# Patient Record
Sex: Male | Born: 1960 | Race: Black or African American | Hispanic: No | Marital: Single | Smoking: Current every day smoker
Health system: Southern US, Community
[De-identification: ages and names within clinical notes are randomized; demographics above are authoritative.]

## PROBLEM LIST (undated history)

## (undated) DIAGNOSIS — E785 Hyperlipidemia, unspecified: Secondary | ICD-10-CM

## (undated) DIAGNOSIS — K4090 Unilateral inguinal hernia, without obstruction or gangrene, not specified as recurrent: Secondary | ICD-10-CM

## (undated) DIAGNOSIS — I10 Essential (primary) hypertension: Secondary | ICD-10-CM

## (undated) DIAGNOSIS — M109 Gout, unspecified: Secondary | ICD-10-CM

---

## 2017-07-09 ENCOUNTER — Encounter: Payer: Self-pay | Admitting: Emergency Medicine

## 2017-07-09 ENCOUNTER — Emergency Department
Admission: EM | Admit: 2017-07-09 | Discharge: 2017-07-09 | Disposition: A | Discharge status: Home / Self Care | Payer: Self-pay | Attending: Emergency Medicine | Admitting: Emergency Medicine

## 2017-07-09 ENCOUNTER — Emergency Department: Payer: Self-pay

## 2017-07-09 DIAGNOSIS — R42 Dizziness and giddiness: Secondary | ICD-10-CM | POA: Insufficient documentation

## 2017-07-09 DIAGNOSIS — I10 Essential (primary) hypertension: Secondary | ICD-10-CM | POA: Insufficient documentation

## 2017-07-09 DIAGNOSIS — F17200 Nicotine dependence, unspecified, uncomplicated: Secondary | ICD-10-CM | POA: Insufficient documentation

## 2017-07-09 LAB — BASIC METABOLIC PANEL
ANION GAP: 12 (ref 5–15)
BUN: 14 mg/dL (ref 6–20)
CO2: 23 mmol/L (ref 22–32)
Calcium: 9.1 mg/dL (ref 8.9–10.3)
Chloride: 103 mmol/L (ref 101–111)
Creatinine, Ser: 1.03 mg/dL (ref 0.61–1.24)
Glucose, Bld: 139 mg/dL — ABNORMAL HIGH (ref 65–99)
Potassium: 4 mmol/L (ref 3.5–5.1)
SODIUM: 138 mmol/L (ref 135–145)

## 2017-07-09 LAB — URINALYSIS, COMPLETE (UACMP) WITH MICROSCOPIC
BILIRUBIN URINE: NEGATIVE
Bacteria, UA: NONE SEEN
Glucose, UA: 50 mg/dL — AB
HGB URINE DIPSTICK: NEGATIVE
Ketones, ur: NEGATIVE mg/dL
LEUKOCYTES UA: NEGATIVE
NITRITE: NEGATIVE
PH: 5 (ref 5.0–8.0)
Protein, ur: 100 mg/dL — AB
SPECIFIC GRAVITY, URINE: 1.02 (ref 1.005–1.030)

## 2017-07-09 LAB — CBC
HCT: 46.5 % (ref 40.0–52.0)
Hemoglobin: 15.5 g/dL (ref 13.0–18.0)
MCH: 30.4 pg (ref 26.0–34.0)
MCHC: 33.3 g/dL (ref 32.0–36.0)
MCV: 91.2 fL (ref 80.0–100.0)
Platelets: 334 10*3/uL (ref 150–440)
RBC: 5.1 MIL/uL (ref 4.40–5.90)
RDW: 12.8 % (ref 11.5–14.5)
WBC: 6.7 10*3/uL (ref 3.8–10.6)

## 2017-07-09 LAB — GLUCOSE, CAPILLARY: Glucose-Capillary: 100 mg/dL — ABNORMAL HIGH (ref 65–99)

## 2017-07-09 LAB — TROPONIN I

## 2017-07-09 MED ORDER — AMLODIPINE BESYLATE 10 MG PO TABS: 10.0000 mg | ORAL_TABLET | Freq: Every day | ORAL | 1 refills | Status: DC

## 2017-07-09 MED ORDER — MECLIZINE HCL 25 MG PO TABS
25.0000 mg | ORAL_TABLET | Freq: Once | ORAL | Status: AC
  Administered 2017-07-09: 25 mg via ORAL
  Filled 2017-07-09: qty 1

## 2017-07-09 MED ORDER — MECLIZINE HCL 25 MG PO TABS: 25.0000 mg | ORAL_TABLET | Freq: Three times a day (TID) | ORAL | 0 refills | Status: DC | PRN

## 2017-07-09 MED ORDER — SODIUM CHLORIDE 0.9 % IV SOLN
1000.0000 mL | Freq: Once | INTRAVENOUS | Status: AC
  Administered 2017-07-09: 1000 mL via INTRAVENOUS

## 2017-07-09 NOTE — ED Notes (Signed)
Pt refused wheelchair to the lobby at this time.  

## 2017-07-09 NOTE — ED Triage Notes (Signed)
Pt c/o lightheadedness for 2 days.  Worse with change position but describes as in head not room spinning when asked.  Has been "spitting up a whole lot". Denies hx vertigo. No pain. Alert and oriented. VSS. No distress noted.

## 2017-07-09 NOTE — ED Notes (Signed)
NAD noted at time of D/C. Pt denies questions or concerns. Pt ambulatory to the lobby at this time.  

## 2017-07-09 NOTE — ED Provider Notes (Addendum)
[facility] Emergency Department Provider Note   ____________________________________________    I have reviewed the triage vital signs and the nursing notes.   HISTORY  Chief Complaint Dizziness     HPI Gary Webb is a 56 y.o. male Who presents with complaints of dizziness. He describes it as a sensation of the room spinning. This started this morning. He has never had this before. He denies neuro deficits or headache. Does feel nauseated when the room is spinning. One episode of vomiting. No fevers or chills. No neck pain. No abdominal pain. No new medications. Has not taken anything for this.no chest pain   History reviewed. No pertinent past medical history.  There are no active problems to display for this patient.   History reviewed. No pertinent surgical history.  Prior to Admission medications   Medication Sig Start Date End Date Taking? Authorizing Provider  amLODipine (NORVASC) 10 MG tablet Take 1 tablet (10 mg total) by mouth daily. 07/09/17 07/09/18  Jene Every, MD  meclizine (ANTIVERT) 25 MG tablet Take 1 tablet (25 mg total) by mouth 3 (three) times daily as needed for dizziness. 07/09/17   Jene Every, MD     Allergies Patient has no known allergies.  History reviewed. No pertinent family history.  Social History Social History  Substance Use Topics  . Smoking status: Current Every Day Smoker  . Smokeless tobacco: Never Used  . Alcohol use No    Review of Systems  Constitutional: No fever/chills Eyes: No visual changes.  ENT: No sore throat. Cardiovascular: Denies chest pain. Respiratory: Denies shortness of breath. Gastrointestinal: No abdominal pain.  Genitourinary: Negative for dysuria. Musculoskeletal: Negative for back pain. Skin: Negative for rash. Neurological: as above   ____________________________________________   PHYSICAL EXAM:  VITAL SIGNS: ED Triage Vitals  Enc Vitals Group     BP  07/09/17 0908 (!) 184/92     Pulse Rate 07/09/17 0907 75     Resp 07/09/17 0907 (!) 22     Temp 07/09/17 0907 (!) 97.5 F (36.4 C)     Temp Source 07/09/17 0907 Oral     SpO2 07/09/17 0907 98 %     Weight 07/09/17 0907 81.6 kg (180 lb)     Height 07/09/17 0907 1.676 m (5\' 6" )     Head Circumference --      Peak Flow --      Pain Score --      Pain Loc --      Pain Edu? --      Excl. in GC? --     Constitutional: Alert and oriented. No acute distress. Pleasant and interactive Eyes: Conjunctivae are normal. PERRLA, EOMI Head: Atraumatic.  Mouth/Throat: Mucous membranes are moist.   Neck:  Painless ROM Cardiovascular: Normal rate, regular rhythm. Grossly normal heart sounds.  Good peripheral circulation. Respiratory: Normal respiratory effort.  No retractions. Lungs CTAB. Gastrointestinal: Soft and nontender. No distention.  No CVA tenderness.  Musculoskeletal: No lower extremity tenderness nor edema.  Warm and well perfused Neurologic:  Normal speech and language. No gross focal neurologic deficits are appreciated. cranial nerves II through XII normal Skin:  Skin is warm, dry and intact. No rash noted. Psychiatric: Mood and affect are normal. Speech and behavior are normal.  ____________________________________________   LABS (all labs ordered are listed, but only abnormal results are displayed)  Labs Reviewed  BASIC METABOLIC PANEL - Abnormal; Notable for the following:       Result Value  Glucose, Bld 139 (*)    All other components within normal limits  URINALYSIS, COMPLETE (UACMP) WITH MICROSCOPIC - Abnormal; Notable for the following:    Color, Urine YELLOW (*)    APPearance CLEAR (*)    Glucose, UA 50 (*)    Protein, ur 100 (*)    Squamous Epithelial / LPF 0-5 (*)    All other components within normal limits  GLUCOSE, CAPILLARY - Abnormal; Notable for the following:    Glucose-Capillary 100 (*)    All other components within normal limits  CBC  TROPONIN I    CBG MONITORING, ED   ____________________________________________  EKG  ED ECG REPORT I, Jene EveryKINNER, Johneisha Broaden, the attending physician, personally viewed and interpreted this ECG.  Date: 07/09/2017  Rhythm: normal sinus rhythm QRS Axis: normal Intervals: normal ST/T Wave abnormalities: nonspecific changes Narrative Interpretation: no evidence of acute ischemia  ____________________________________________  RADIOLOGY  CT head no acute distress ____________________________________________   PROCEDURES  Procedure(s) performed: No    Critical Care performed: No ____________________________________________   INITIAL IMPRESSION / ASSESSMENT AND PLAN / ED COURSE  Pertinent labs & imaging results that were available during my care of the patient were reviewed by me and considered in my medical decision making (see chart for details).  patient presents with symptoms of vertigo. No neuro deficits or concerning findings. We will treat with IV fluids, meclizine and nausea medications and reevaluate.lab work is reassuring.  After treatment patient reports feeling significant better. He is steady on his feet. CT scan unremarkable. We'll discharge with meclizine and ENT follow-up. Return precautions discussed.  Patient with a history of high blood pressure, not taking any medications. I will prescribe him amlodipine for his chronic hypertension, recommended follow-up to recheck his blood pressure in one week    ____________________________________________   FINAL CLINICAL IMPRESSION(S) / ED DIAGNOSES  Final diagnoses:  Vertigo  Essential hypertension      NEW MEDICATIONS STARTED DURING THIS VISIT:  Discharge Medication List as of 07/09/2017  1:17 PM    START taking these medications   Details  amLODipine (NORVASC) 10 MG tablet Take 1 tablet (10 mg total) by mouth daily., Starting Thu 07/09/2017, Until Fri 07/09/2018, Print    meclizine (ANTIVERT) 25 MG tablet Take 1  tablet (25 mg total) by mouth 3 (three) times daily as needed for dizziness., Starting Thu 07/09/2017, Print         Note:  This document was prepared using Dragon voice recognition software and may include unintentional dictation errors.    Jene EveryKinner, Ardelia Wrede, MD 07/09/17 1507    Jene EveryKinner, Charlean Carneal, MD 07/09/17 [phone]

## 2018-05-20 ENCOUNTER — Other Ambulatory Visit: Payer: Self-pay

## 2018-05-20 ENCOUNTER — Emergency Department: Payer: Self-pay

## 2018-05-20 ENCOUNTER — Encounter: Payer: Self-pay | Admitting: *Deleted

## 2018-05-20 ENCOUNTER — Emergency Department
Admission: EM | Admit: 2018-05-20 | Discharge: 2018-05-20 | Disposition: A | Discharge status: Home / Self Care | Payer: Self-pay | Attending: Emergency Medicine | Admitting: Emergency Medicine

## 2018-05-20 DIAGNOSIS — S50811A Abrasion of right forearm, initial encounter: Secondary | ICD-10-CM | POA: Insufficient documentation

## 2018-05-20 DIAGNOSIS — Y9389 Activity, other specified: Secondary | ICD-10-CM | POA: Insufficient documentation

## 2018-05-20 DIAGNOSIS — F17200 Nicotine dependence, unspecified, uncomplicated: Secondary | ICD-10-CM | POA: Insufficient documentation

## 2018-05-20 DIAGNOSIS — Z760 Encounter for issue of repeat prescription: Secondary | ICD-10-CM | POA: Insufficient documentation

## 2018-05-20 DIAGNOSIS — Y999 Unspecified external cause status: Secondary | ICD-10-CM | POA: Insufficient documentation

## 2018-05-20 DIAGNOSIS — I10 Essential (primary) hypertension: Secondary | ICD-10-CM | POA: Insufficient documentation

## 2018-05-20 DIAGNOSIS — S50819A Abrasion of unspecified forearm, initial encounter: Secondary | ICD-10-CM

## 2018-05-20 DIAGNOSIS — Y929 Unspecified place or not applicable: Secondary | ICD-10-CM | POA: Insufficient documentation

## 2018-05-20 LAB — CBC WITH DIFFERENTIAL/PLATELET
Basophils Absolute: 0 10*3/uL (ref 0–0.1)
Basophils Relative: 1 %
Eosinophils Absolute: 0.1 10*3/uL (ref 0–0.7)
Eosinophils Relative: 1 %
HEMATOCRIT: 44.4 % (ref 40.0–52.0)
Hemoglobin: 15.5 g/dL (ref 13.0–18.0)
LYMPHS ABS: 1.9 10*3/uL (ref 1.0–3.6)
LYMPHS PCT: 35 %
MCH: 31.7 pg (ref 26.0–34.0)
MCHC: 34.9 g/dL (ref 32.0–36.0)
MCV: 90.8 fL (ref 80.0–100.0)
MONO ABS: 0.4 10*3/uL (ref 0.2–1.0)
MONOS PCT: 8 %
Neutro Abs: 3 10*3/uL (ref 1.4–6.5)
Neutrophils Relative %: 55 %
Platelets: 384 10*3/uL (ref 150–440)
RBC: 4.88 MIL/uL (ref 4.40–5.90)
RDW: 13 % (ref 11.5–14.5)
WBC: 5.4 10*3/uL (ref 3.8–10.6)

## 2018-05-20 LAB — COMPREHENSIVE METABOLIC PANEL
ALBUMIN: 4.4 g/dL (ref 3.5–5.0)
ALT: 32 U/L (ref 0–44)
ANION GAP: 8 (ref 5–15)
AST: 28 U/L (ref 15–41)
Alkaline Phosphatase: 88 U/L (ref 38–126)
BUN: 22 mg/dL — ABNORMAL HIGH (ref 6–20)
CO2: 23 mmol/L (ref 22–32)
Calcium: 9.5 mg/dL (ref 8.9–10.3)
Chloride: 109 mmol/L (ref 98–111)
Creatinine, Ser: 1.03 mg/dL (ref 0.61–1.24)
GFR calc Af Amer: >60 mL/min (ref >60)
GFR calc non Af Amer: >60 mL/min (ref >60)
GLUCOSE: 108 mg/dL — AB (ref 70–99)
POTASSIUM: 3.9 mmol/L (ref 3.5–5.1)
SODIUM: 140 mmol/L (ref 135–145)
Total Bilirubin: 0.7 mg/dL (ref 0.3–1.2)
Total Protein: 8.4 g/dL — ABNORMAL HIGH (ref 6.5–8.1)

## 2018-05-20 MED ORDER — AMLODIPINE BESYLATE 10 MG PO TABS: 10.0000 mg | ORAL_TABLET | Freq: Every day | ORAL | 1 refills | Status: DC

## 2018-05-20 NOTE — ED Notes (Signed)
McShane MD at bedside. 
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
## 2018-05-20 NOTE — Discharge Instructions (Signed)
Keep the area clean and dry, change the dressings on there twice a day as we have described, follow-up with the wound care, if you get redness around the area fever, streaks from the area increased pain or other concerns return to the emergency department.
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
## 2018-07-20 ENCOUNTER — Emergency Department
Admission: EM | Admit: 2018-07-20 | Discharge: 2018-07-20 | Disposition: A | Discharge status: Home / Self Care | Payer: Self-pay | Attending: Emergency Medicine | Admitting: Emergency Medicine

## 2018-07-20 ENCOUNTER — Encounter: Payer: Self-pay | Admitting: Emergency Medicine

## 2018-07-20 ENCOUNTER — Other Ambulatory Visit: Payer: Self-pay

## 2018-07-20 DIAGNOSIS — F17200 Nicotine dependence, unspecified, uncomplicated: Secondary | ICD-10-CM | POA: Insufficient documentation

## 2018-07-20 DIAGNOSIS — I10 Essential (primary) hypertension: Secondary | ICD-10-CM | POA: Insufficient documentation

## 2018-07-20 DIAGNOSIS — Z5189 Encounter for other specified aftercare: Secondary | ICD-10-CM | POA: Insufficient documentation

## 2018-07-20 DIAGNOSIS — Z760 Encounter for issue of repeat prescription: Secondary | ICD-10-CM | POA: Insufficient documentation

## 2018-07-20 HISTORY — DX: Essential (primary) hypertension: I10

## 2018-07-20 MED ORDER — AMLODIPINE BESYLATE 10 MG PO TABS: 10.0000 mg | ORAL_TABLET | Freq: Every day | ORAL | 1 refills | Status: DC

## 2018-07-20 NOTE — Discharge Instructions (Signed)
Please establish care with primary care as soon as possible for further work-up of your high blood pressure and continue management of your medication.

## 2018-07-20 NOTE — ED Notes (Signed)
See triage note. Pt presents for refill of amlodipine. Pt states he went to [facility] today to get refill and was told he needs a new Rx. Last prescribed by Alphonzo Lemmings, MD. Pt also states he was told to have R elbow wound checked. Wound appears to have healed completely. No s/sx infection noted at this time.

## 2018-07-20 NOTE — ED Provider Notes (Signed)
[facility] Emergency Department Provider Note  ____________________________________________  Time seen: Approximately 2:23 PM  I have reviewed the triage vital signs and the nursing notes.   HISTORY  Chief Complaint Wound Check    HPI Gary Webb is a 57 y.o. male presents emergency department for medication refill and wound recheck.  Patient states that he had a wound to his right elbow 2 months ago.  He was told to follow-up and have it looked out again.  He states that wound has completely healed.  It does not hurt to touch and he has no difficulty moving his elbow.  He was given a prescription for amlodipine 2 months ago from the emergency department but does not have a primary care provider so has not been able to get a refill.  He has [facility] this morning to refill prescription and was told that there are no more refills.  He has been taking his blood pressure daily.  No headache, chest pain, shortness of breath.   Past Medical History:  Diagnosis Date  . Hypertension     There are no active problems to display for this patient.   History reviewed. No pertinent surgical history.  Prior to Admission medications   Medication Sig Start Date End Date Taking? Authorizing Provider  amLODipine (NORVASC) 10 MG tablet Take 1 tablet (10 mg total) by mouth daily. 07/20/18 07/20/19  Enid Derry, PA-C  meclizine (ANTIVERT) 25 MG tablet Take 1 tablet (25 mg total) by mouth 3 (three) times daily as needed for dizziness. 07/09/17   Jene Every, MD    Allergies Patient has no known allergies.  No family history on file.  Social History Social History   Tobacco Use  . Smoking status: Current Every Day Smoker  . Smokeless tobacco: Never Used  Substance Use Topics  . Alcohol use: No  . Drug use: No     Review of Systems  Constitutional: No fever/chills Cardiovascular: No chest pain. Respiratory: No SOB. Gastrointestinal: No nausea, no  vomiting.  Musculoskeletal: Negative for musculoskeletal pain. Skin: Negative for lacerations, ecchymosis. Neurological: Negative for headaches   ____________________________________________   PHYSICAL EXAM:  VITAL SIGNS: ED Triage Vitals [07/20/18 1304]  Enc Vitals Group     BP (!) 154/99     Pulse Rate 78     Resp 18     Temp 98.5 F (36.9 C)     Temp Source Oral     SpO2 97 %     Weight 238 lb (108 kg)     Height 5\' 6"  (1.676 m)     Head Circumference      Peak Flow      Pain Score 0     Pain Loc      Pain Edu?      Excl. in GC?      Constitutional: Alert and oriented. Well appearing and in no acute distress. Eyes: Conjunctivae are normal. PERRL. EOMI. Head: Atraumatic. ENT:      Ears:      Nose: No congestion/rhinnorhea.      Mouth/Throat: Mucous membranes are moist.  Neck: No stridor.   Cardiovascular: Normal rate, regular rhythm.  Good peripheral circulation. Respiratory: Normal respiratory effort without tachypnea or retractions. Lungs CTAB. Good air entry to the bases with no decreased or absent breath sounds. Musculoskeletal: Full range of motion to all extremities. No gross deformities appreciated. Neurologic:  Normal speech and language. No gross focal neurologic deficits are appreciated.  Skin:  Skin  is warm, dry.  Healed pink skin to right forearm. No tenderness to palpation.  No surrounding erythema.  Full range of motion of elbow. Psychiatric: Mood and affect are normal. Speech and behavior are normal. Patient exhibits appropriate insight and judgement.   ____________________________________________   LABS (all labs ordered are listed, but only abnormal results are displayed)  Labs Reviewed - No data to display ____________________________________________  EKG   ____________________________________________  RADIOLOGY   No results found.  ____________________________________________    PROCEDURES  Procedure(s) performed:     Procedures    Medications - No data to display   ____________________________________________   INITIAL IMPRESSION / ASSESSMENT AND PLAN / ED COURSE  Pertinent labs & imaging results that were available during my care of the patient were reviewed by me and considered in my medical decision making (see chart for details).  Review of the [place] CSRS was performed in accordance of the [facility] prior to dispensing any controlled drugs.     Patient presented to the emergency department for reevaluation of the wound 2 months ago.  Wound is well-healed with some remaining pink skin.  No signs of infection.  Patient would like his blood pressure medication refilled. He denies headache, CP, SOB, nausea, vomiting.  Patient was encouraged to follow-up with primary care and [facility].  Patient will be discharged home with prescriptions for amlodipine. Patient is to follow up with primary care as directed. Patient is given ED precautions to return to the ED for any worsening or new symptoms.     ____________________________________________  FINAL CLINICAL IMPRESSION(S) / ED DIAGNOSES  Final diagnoses:  Visit for wound check  Hypertension, unspecified type      NEW MEDICATIONS STARTED DURING THIS VISIT:  ED Discharge Orders         Ordered    amLODipine (NORVASC) 10 MG tablet  Daily     07/20/18 1501              This chart was dictated using voice recognition software/Dragon. Despite best efforts to proofread, errors can occur which can change the meaning. Any change was purely unintentional.    Enid Derry, PA-C 07/20/18 1531    Arnaldo Natal, MD 07/20/18 [phone]

## 2018-07-20 NOTE — ED Triage Notes (Signed)
PT states he was told to come back in to have his would re evaluated. Pt states he finished his antibiotic. Pt denies any new complaints

## 2019-04-22 ENCOUNTER — Ambulatory Visit
Admission: RE | Admit: 2019-04-22 | Discharge: 2019-04-22 | Disposition: A | Discharge status: Home / Self Care | Payer: Disability Insurance | Source: Ambulatory Visit | Attending: Thoracic Surgery | Admitting: Thoracic Surgery

## 2019-04-22 ENCOUNTER — Other Ambulatory Visit: Payer: Self-pay | Admitting: Thoracic Surgery

## 2019-04-22 DIAGNOSIS — S62101A Fracture of unspecified carpal bone, right wrist, initial encounter for closed fracture: Secondary | ICD-10-CM | POA: Insufficient documentation

## 2019-04-22 DIAGNOSIS — S62102A Fracture of unspecified carpal bone, left wrist, initial encounter for closed fracture: Secondary | ICD-10-CM

## 2019-04-22 DIAGNOSIS — M109 Gout, unspecified: Secondary | ICD-10-CM

## 2020-06-23 IMAGING — CR RIGHT KNEE - 1-2 VIEW
2 series · 2 of 2 positions shown · non-contrast
Comparison: None.

CLINICAL DATA: Chronic bilateral knee pain. Gout.

EXAM:
RIGHT KNEE - 1-2 VIEW

[knee ap]
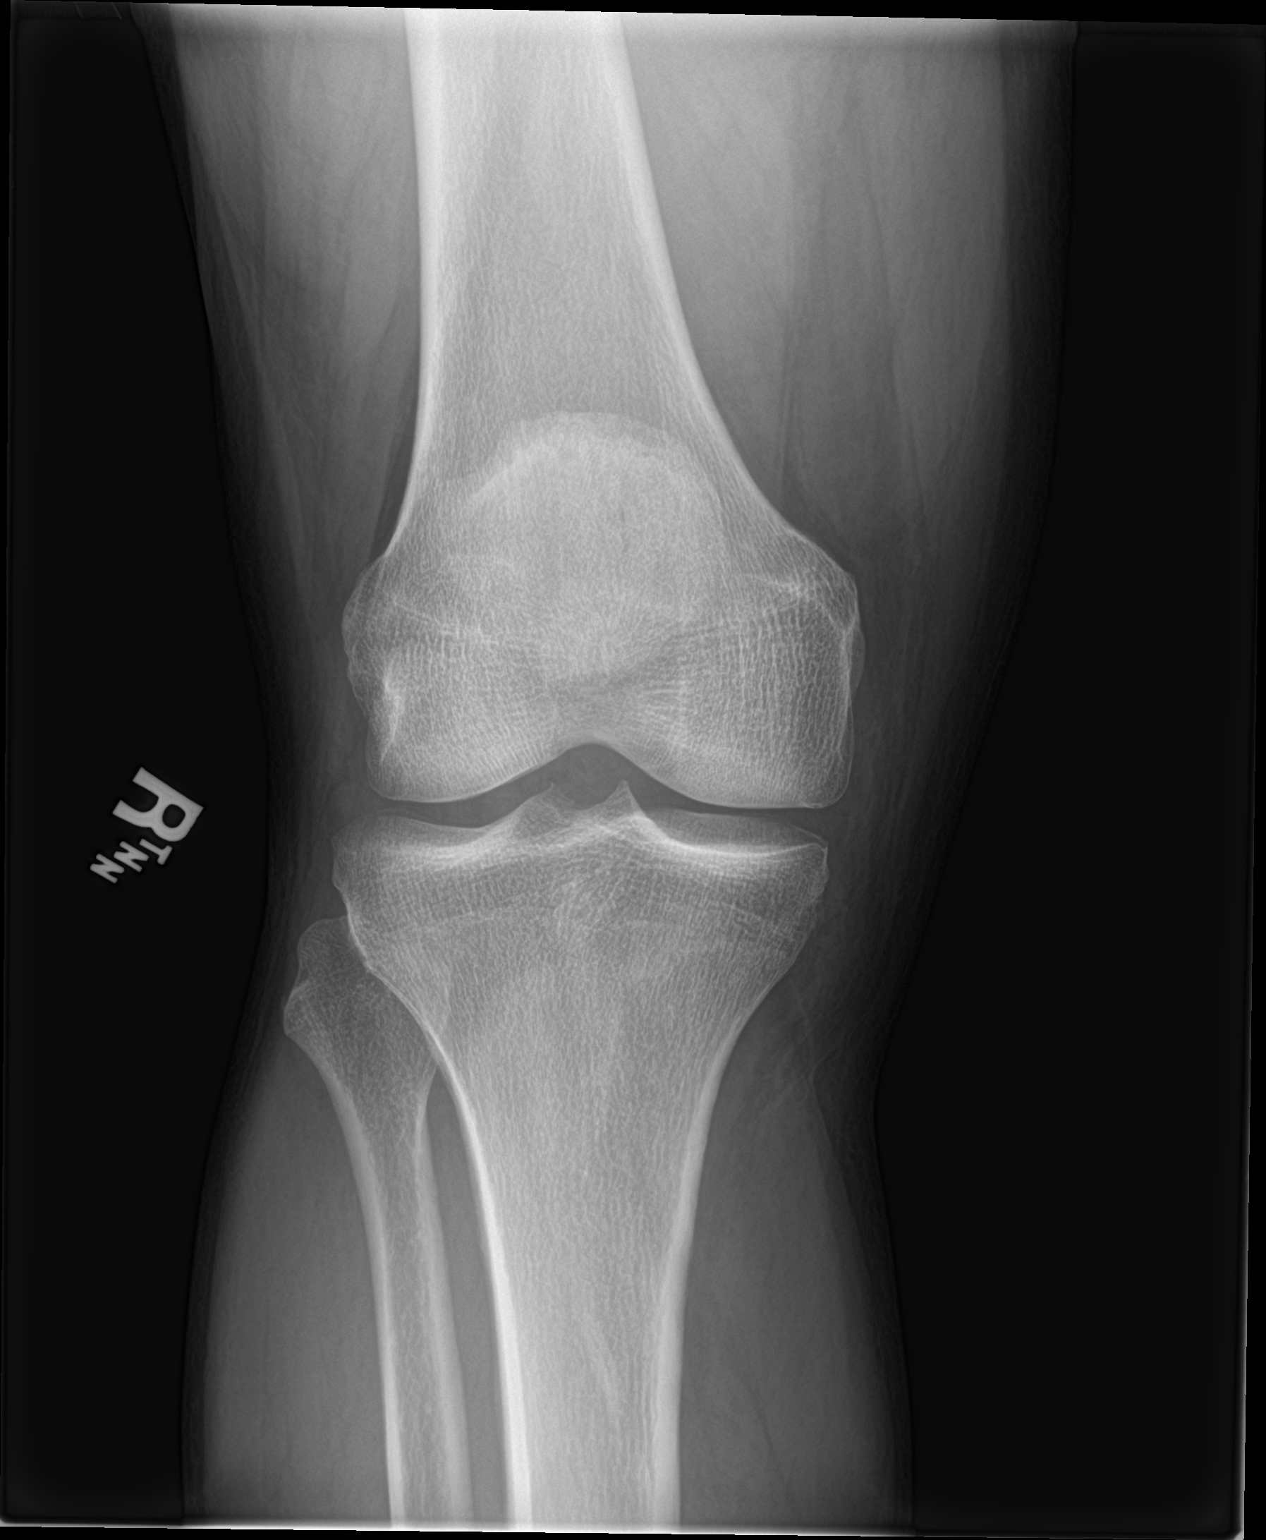

[knee lat]
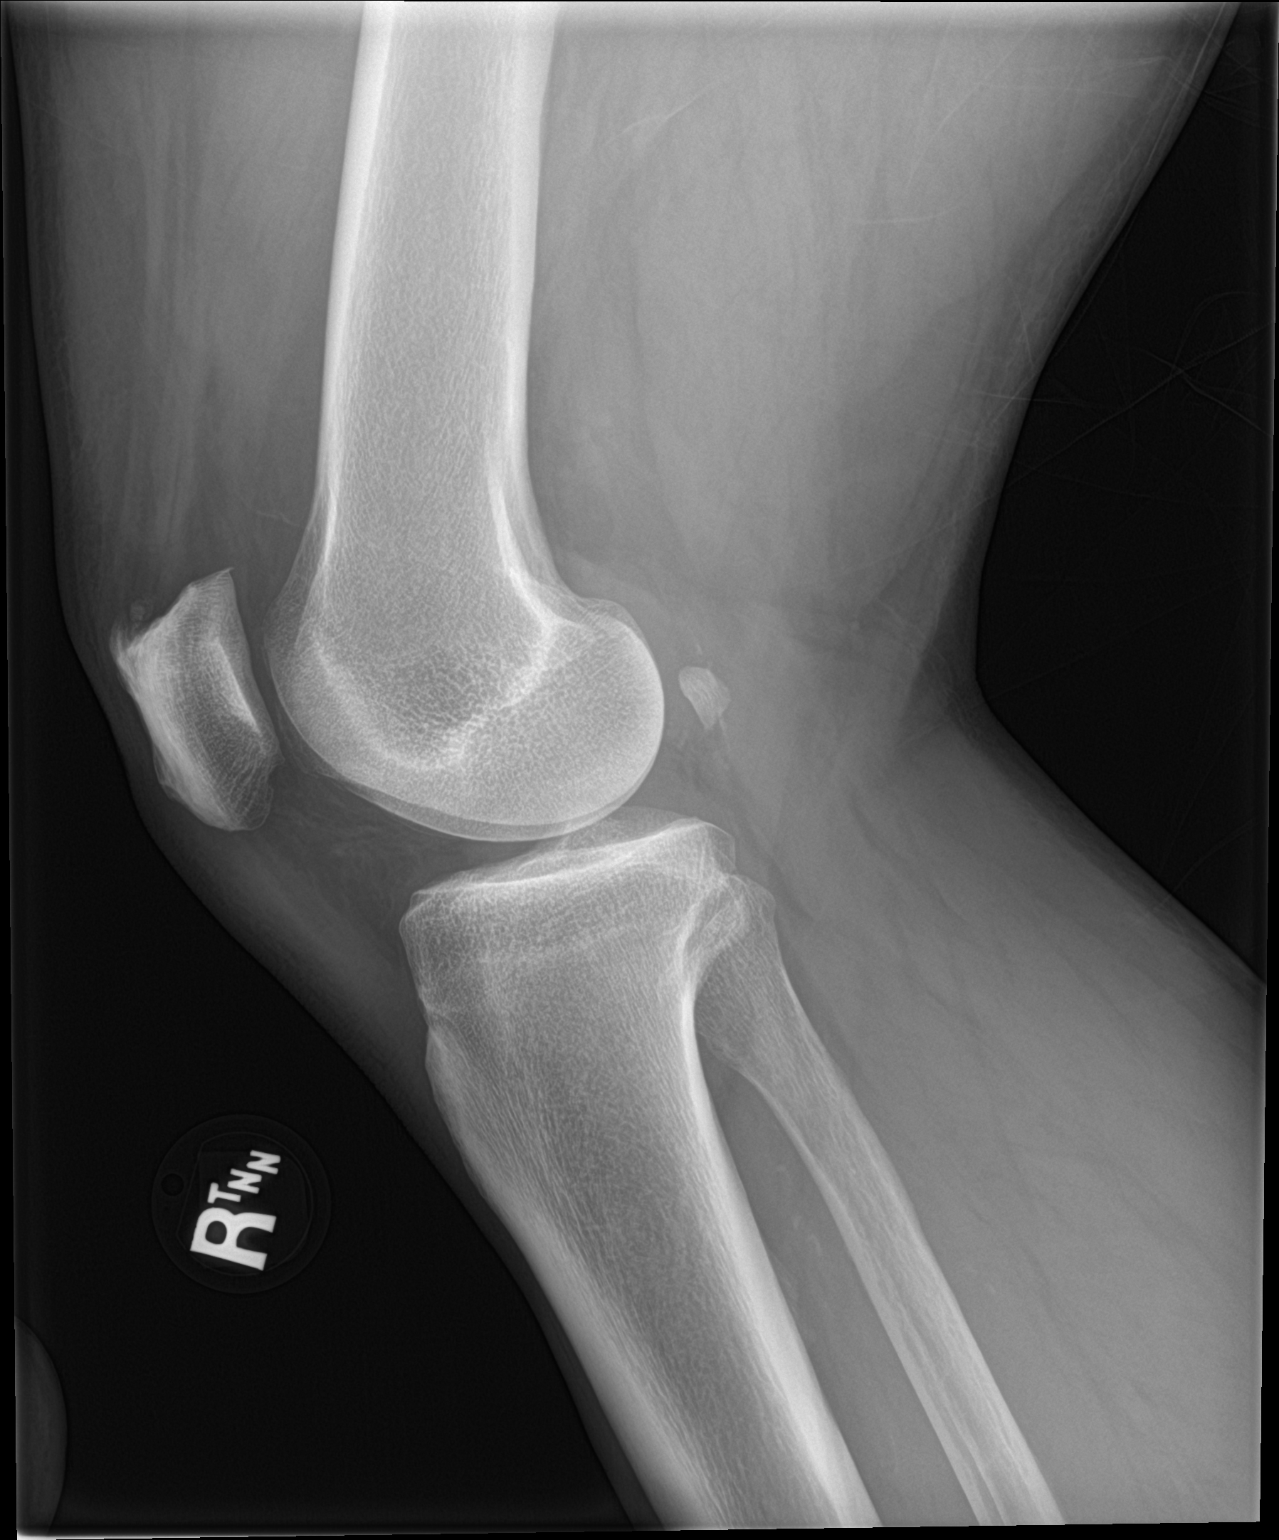

[2 of 2 positions shown; findings below may reference images not displayed]

FINDINGS: No acute fracture, dislocation, or sizable knee joint effusion is
identified. Femorotibial joint space widths are preserved, and no
significant arthropathic changes are identified. A small superior
patellar enthesophyte is noted, and there is scattered mild
atherosclerotic vascular calcification.
IMPRESSION: No acute osseous abnormality or significant arthropathic changes
identified.

## 2020-06-23 IMAGING — CR LEFT KNEE - 1-2 VIEW
2 series · 2 of 2 positions shown · non-contrast
Comparison: None.

CLINICAL DATA: Chronic bilateral knee pain. Gout.

EXAM:
LEFT KNEE - 1-2 VIEW

[knee ap]
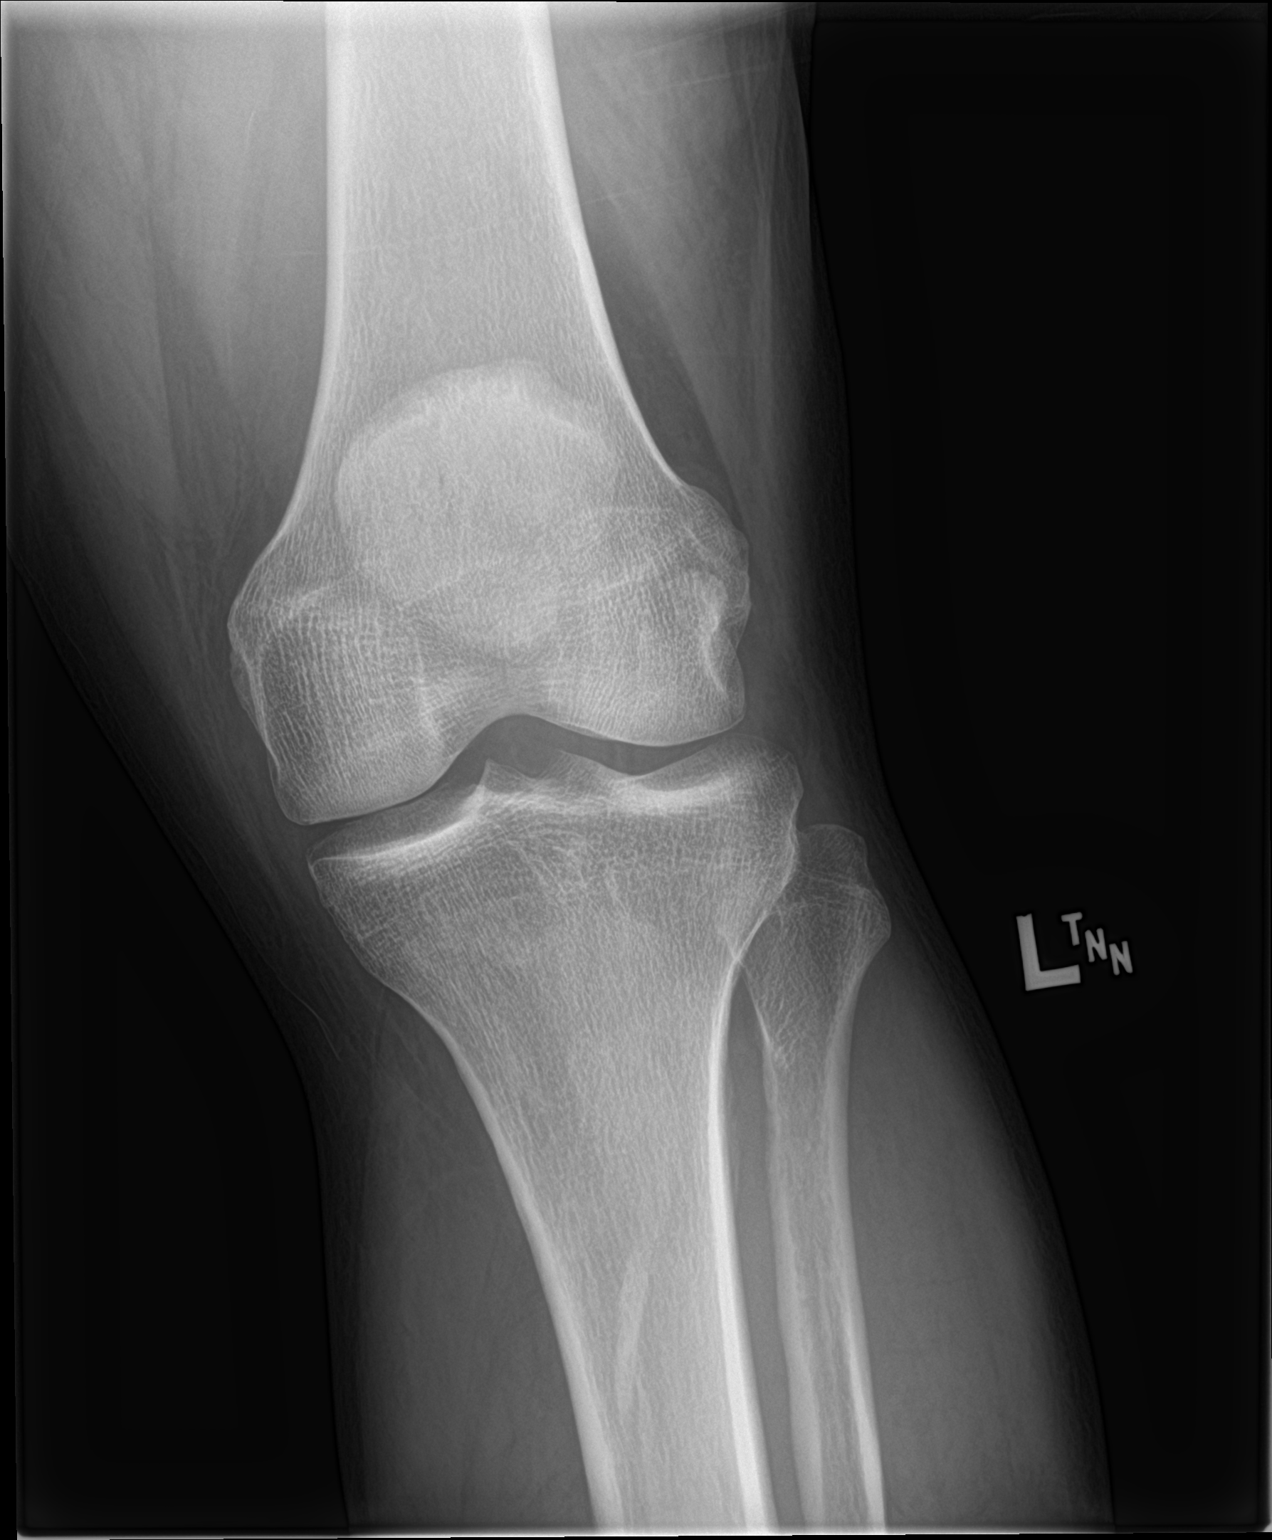

[knee lat]
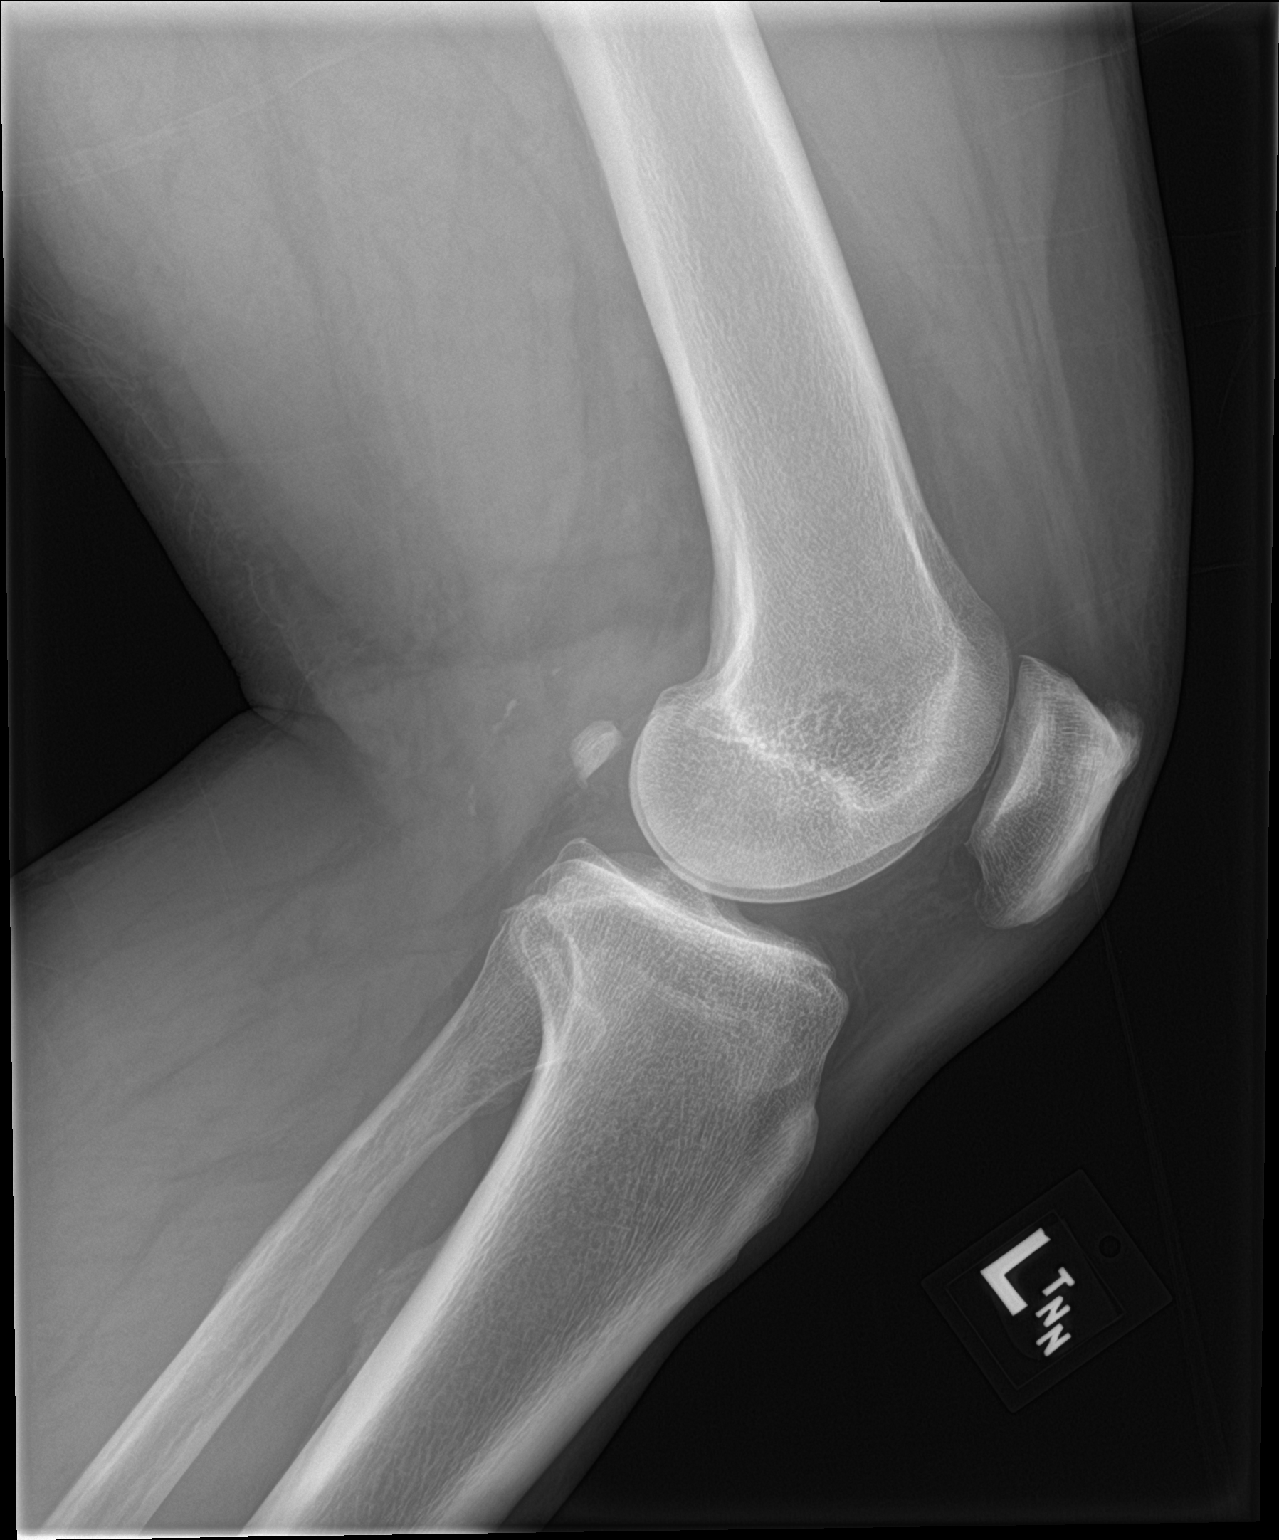

[2 of 2 positions shown; findings below may reference images not displayed]

FINDINGS: No acute fracture, dislocation, or sizable knee joint effusion is
identified. Femorotibial joint space widths are preserved without
significant arthropathic changes identified. There is a small
superior patellar enthesophyte. Mild atherosclerotic vascular
calcification is noted posterior to the knee.
IMPRESSION: No acute osseous abnormality or significant arthropathic changes
identified.

## 2020-06-23 IMAGING — CR LEFT HAND - 2 VIEW
2 series · 2 of 2 positions shown · non-contrast
Comparison: None.

CLINICAL DATA: Chronic bilateral hand pain.

EXAM:
LEFT HAND - 2 VIEW

[hand ap]
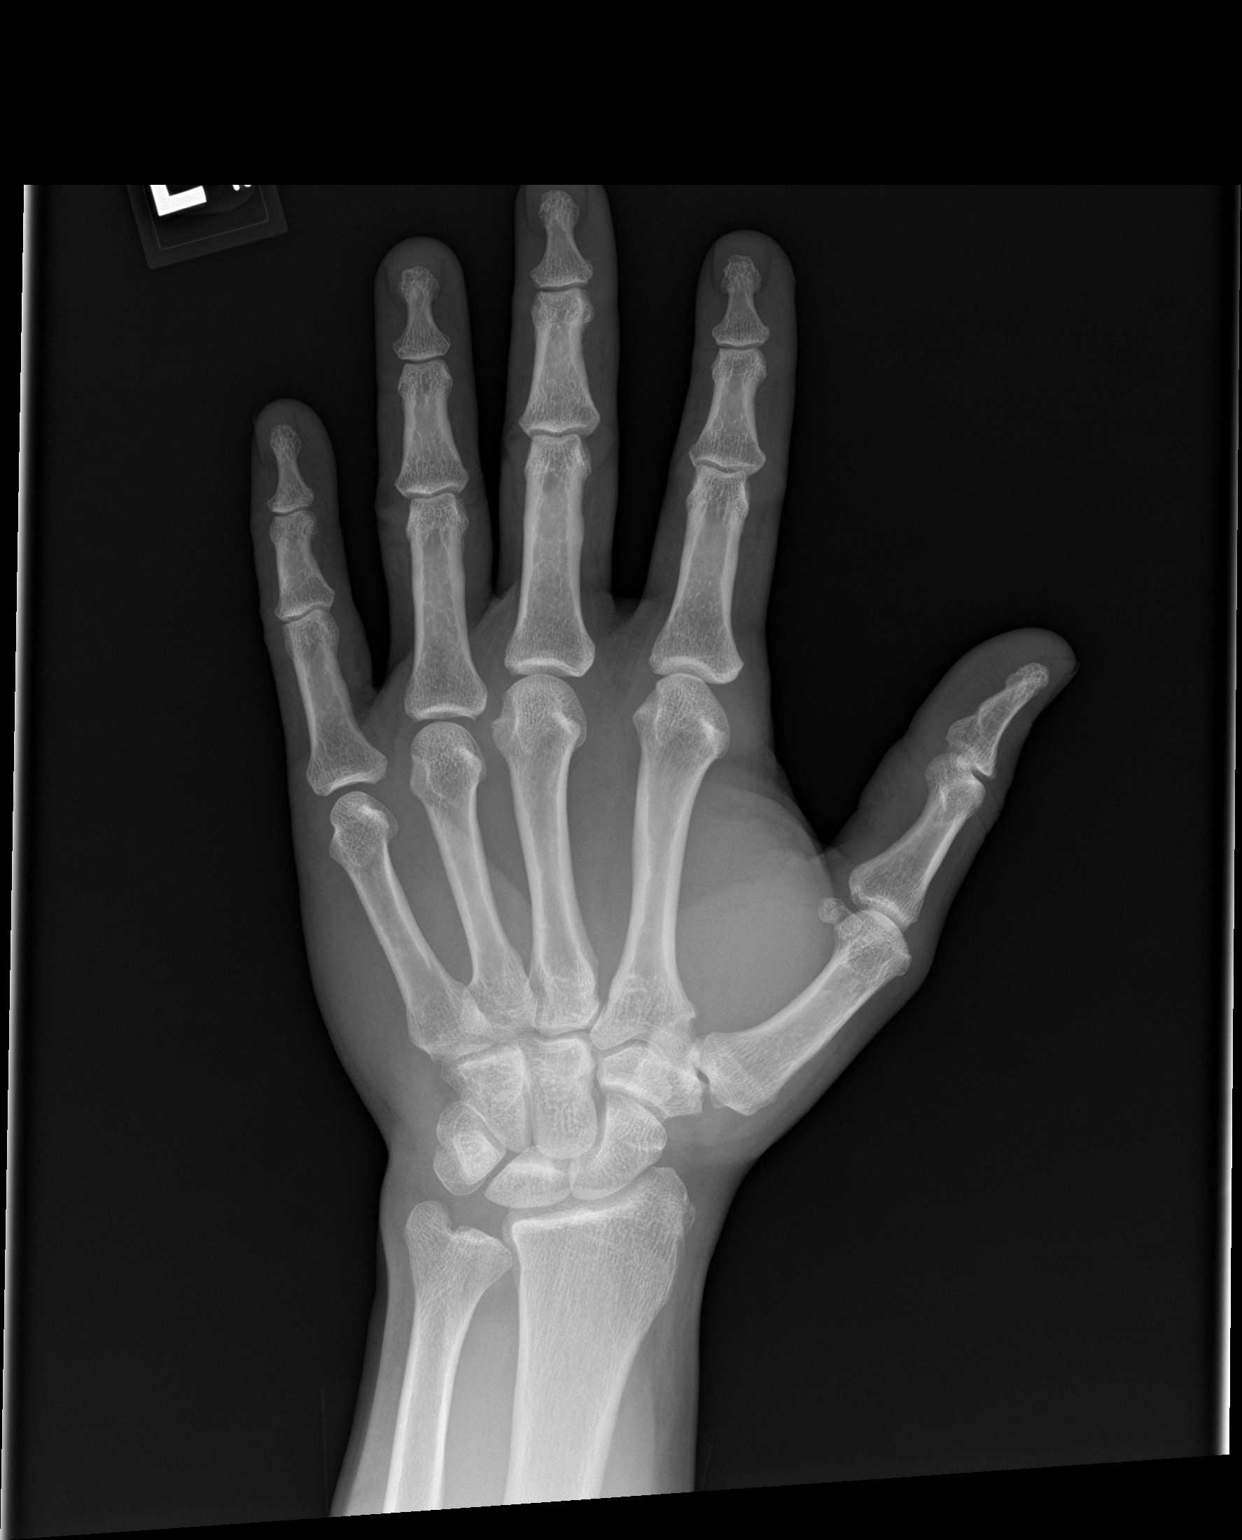

[hand lat]
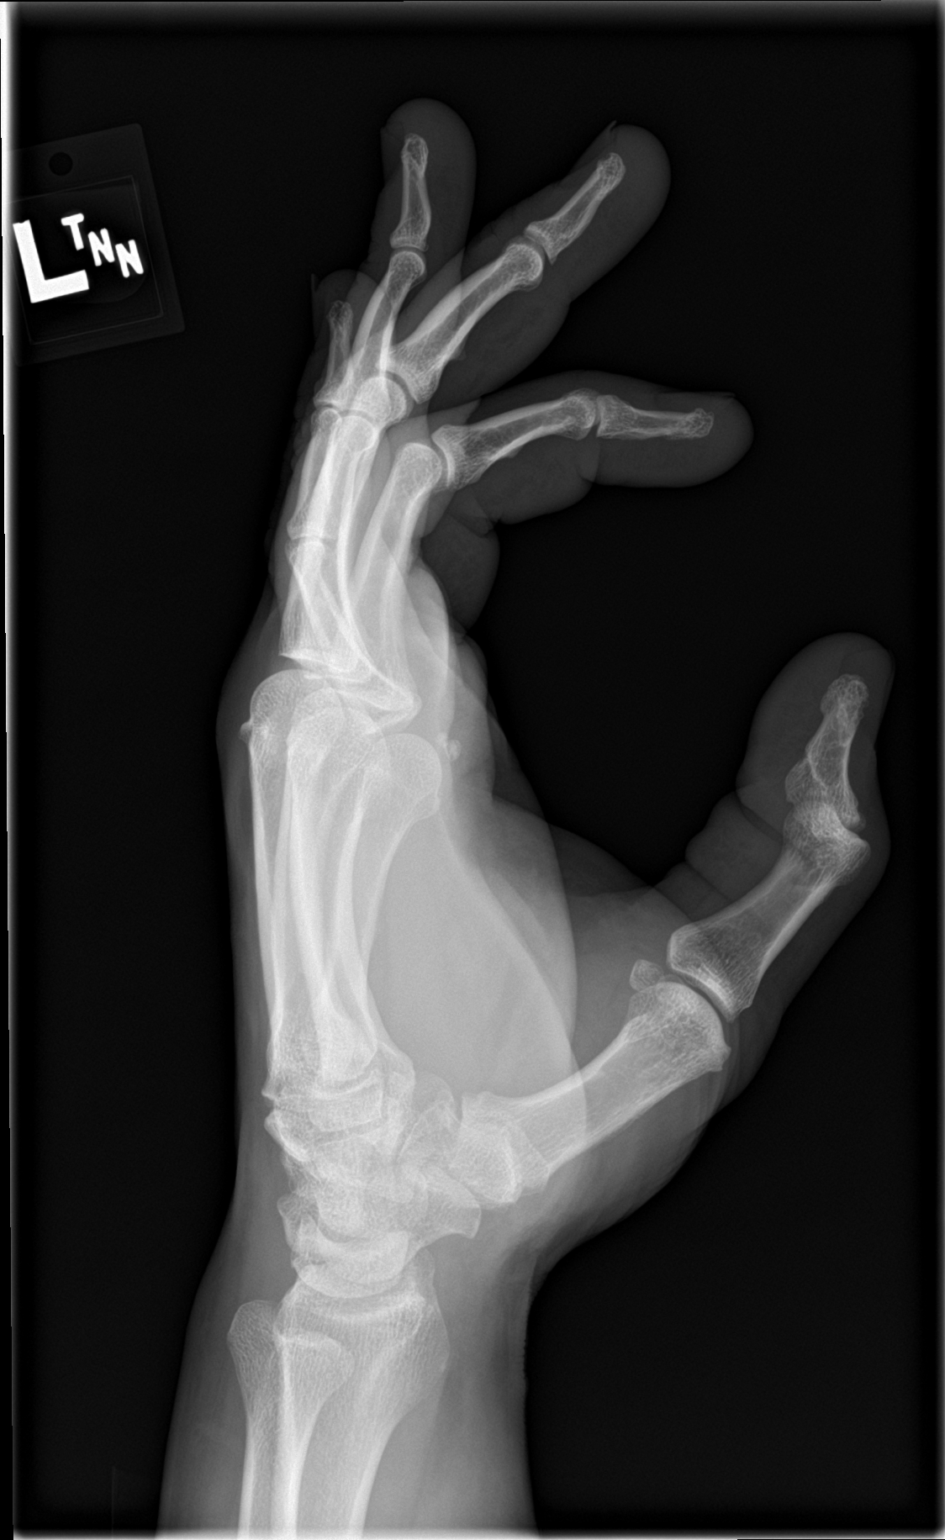

[2 of 2 positions shown; findings below may reference images not displayed]

FINDINGS: There is no evidence of fracture or dislocation. There is no
evidence of arthropathy or other focal bone abnormality. Soft
tissues are unremarkable.
IMPRESSION: Negative.

## 2021-02-25 ENCOUNTER — Other Ambulatory Visit: Payer: Self-pay

## 2021-02-25 ENCOUNTER — Emergency Department: Payer: 59

## 2021-02-25 ENCOUNTER — Emergency Department
Admission: EM | Admit: 2021-02-25 | Discharge: 2021-02-25 | Disposition: A | Discharge status: Home / Self Care | Payer: 59 | Attending: Emergency Medicine | Admitting: Emergency Medicine

## 2021-02-25 ENCOUNTER — Encounter: Payer: Self-pay | Admitting: Emergency Medicine

## 2021-02-25 DIAGNOSIS — M10041 Idiopathic gout, right hand: Secondary | ICD-10-CM | POA: Insufficient documentation

## 2021-02-25 DIAGNOSIS — F17200 Nicotine dependence, unspecified, uncomplicated: Secondary | ICD-10-CM | POA: Diagnosis not present

## 2021-02-25 DIAGNOSIS — M7989 Other specified soft tissue disorders: Secondary | ICD-10-CM | POA: Diagnosis present

## 2021-02-25 DIAGNOSIS — I10 Essential (primary) hypertension: Secondary | ICD-10-CM | POA: Insufficient documentation

## 2021-02-25 DIAGNOSIS — M1000 Idiopathic gout, unspecified site: Secondary | ICD-10-CM

## 2021-02-25 LAB — CBC WITH DIFFERENTIAL/PLATELET
Abs Immature Granulocytes: 0.03 10*3/uL (ref 0.00–0.07)
Basophils Absolute: 0 10*3/uL (ref 0.0–0.1)
Basophils Relative: 1 %
Eosinophils Absolute: 0.2 10*3/uL (ref 0.0–0.5)
Eosinophils Relative: 2 %
HCT: 43.6 % (ref 39.0–52.0)
Hemoglobin: 15.3 g/dL (ref 13.0–17.0)
Immature Granulocytes: 0 %
Lymphocytes Relative: 29 %
Lymphs Abs: 2.5 10*3/uL (ref 0.7–4.0)
MCH: 30.7 pg (ref 26.0–34.0)
MCHC: 35.1 g/dL (ref 30.0–36.0)
MCV: 87.6 fL (ref 80.0–100.0)
Monocytes Absolute: 0.6 10*3/uL (ref 0.1–1.0)
Monocytes Relative: 7 %
Neutro Abs: 5.3 10*3/uL (ref 1.7–7.7)
Neutrophils Relative %: 61 %
Platelets: 397 10*3/uL (ref 150–400)
RBC: 4.98 MIL/uL (ref 4.22–5.81)
RDW: 11.6 % (ref 11.5–15.5)
WBC: 8.6 10*3/uL (ref 4.0–10.5)
nRBC: 0 % (ref 0.0–0.2)

## 2021-02-25 LAB — BASIC METABOLIC PANEL
Anion gap: 7 (ref 5–15)
BUN: 15 mg/dL (ref 6–20)
CO2: 23 mmol/L (ref 22–32)
Calcium: 9.3 mg/dL (ref 8.9–10.3)
Chloride: 106 mmol/L (ref 98–111)
Creatinine, Ser: 0.72 mg/dL (ref 0.61–1.24)
GFR, Estimated: >60 mL/min (ref >60)
Glucose, Bld: 99 mg/dL (ref 70–99)
Potassium: 4.2 mmol/L (ref 3.5–5.1)
Sodium: 136 mmol/L (ref 135–145)

## 2021-02-25 LAB — URIC ACID: Uric Acid, Serum: 7.7 mg/dL (ref 3.7–8.6)

## 2021-02-25 MED ORDER — ALLOPURINOL 300 MG PO TABS
300.0000 mg | ORAL_TABLET | Freq: Every day | ORAL | 3 refills | Status: AC
  Filled 2021-02-25 – 2021-02-26 (×2): qty 30, 30d supply, fill #0

## 2021-02-25 MED ORDER — LISINOPRIL 20 MG PO TABS
20.0000 mg | ORAL_TABLET | Freq: Every day | ORAL | 11 refills | Status: AC
  Filled 2021-02-25 – 2021-02-26 (×2): qty 30, 30d supply, fill #0

## 2021-02-25 MED ORDER — PREDNISONE 10 MG PO TABS
ORAL_TABLET | ORAL | 0 refills | Status: DC
  Filled 2021-02-25 – 2021-02-26 (×2): qty 21, 6d supply, fill #0

## 2021-02-25 MED ORDER — DEXAMETHASONE SODIUM PHOSPHATE 10 MG/ML IJ SOLN
10.0000 mg | Freq: Once | INTRAMUSCULAR | Status: AC
  Administered 2021-02-25: 10 mg via INTRAMUSCULAR
  Filled 2021-02-25: qty 1

## 2021-02-25 MED ORDER — LISINOPRIL 10 MG PO TABS
20.0000 mg | ORAL_TABLET | Freq: Once | ORAL | Status: AC
  Administered 2021-02-25: 20 mg via ORAL
  Filled 2021-02-25: qty 2

## 2021-02-25 NOTE — ED Notes (Signed)
See triage note  Presents with swelling to right hand  States did have some swelling to hand on Friday  But eased off and then returned today  No injury good pulses

## 2021-02-25 NOTE — ED Triage Notes (Signed)
First Nurse Note:  C/O right hand swelling.  Initially noticed on Friday, symptoms resolved and returned this morning.  Per EMS VS wnl.

## 2021-02-25 NOTE — ED Provider Notes (Signed)
[facility] Emergency Department Provider Note  ____________________________________________   Event Date/Time   First MD Initiated Contact with Patient 02/25/21 1119     (approximate)  I have reviewed the triage vital signs and the nursing notes.   HISTORY  Chief Complaint Hand Swelling    HPI Gary Webb is a 60 y.o. male presents emergency department with right hand swelling.  Patient states had swelling on Friday and used off a little bit and returns today becoming worse.  Has a history of gout.  He denies any fever or chills.  No cuts or wounds.  No recent blood draws or IV in the right arm.  He denies injury.  Patient usually takes allopurinol for his gout, lisinopril for blood pressure, and indomethacin for gout    Past Medical History:  Diagnosis Date  . Hypertension     There are no problems to display for this patient.   History reviewed. No pertinent surgical history.  Prior to Admission medications   Medication Sig Start Date End Date Taking? Authorizing Provider  indomethacin (INDOCIN) 25 MG capsule Take 25 mg by mouth 2 (two) times daily with a meal.   Yes [provider]  lisinopril (ZESTRIL) 20 MG tablet Take 1 tablet (20 mg total) by mouth daily. 02/25/21 02/25/22 Yes Darek Eifler, Roselyn Bering, PA-C  predniSONE (DELTASONE) 10 MG tablet Take 6 tablets by mouth on day 1, then decrease by 1 tablet each day till completed. 02/25/21  Yes Zaia Carre, Roselyn Bering, PA-C  rosuvastatin (CRESTOR) 10 MG tablet Take 10 mg by mouth daily.   Yes [provider]  allopurinol (ZYLOPRIM) 300 MG tablet Take 1 tablet (300 mg total) by mouth daily. 02/25/21   Faythe Ghee, PA-C    Allergies Other  History reviewed. No pertinent family history.  Social History Social History   Tobacco Use  . Smoking status: Current Every Day Smoker  . Smokeless tobacco: Never Used  Substance Use Topics  . Alcohol use: No  . Drug use: No    Review of  Systems  Constitutional: No fever/chills Eyes: No visual changes. ENT: No sore throat. Respiratory: Denies cough Cardiovascular: Denies chest pain Gastrointestinal: Denies abdominal pain Genitourinary: Negative for dysuria. Musculoskeletal: Negative for back pain.  Right hand pain Skin: Negative for rash. Psychiatric: no mood changes,     ____________________________________________   PHYSICAL EXAM:  VITAL SIGNS: ED Triage Vitals  Enc Vitals Group     BP 02/25/21 0923 (!) 181/125     Pulse Rate 02/25/21 0923 83     Resp 02/25/21 0923 (!) 22     Temp 02/25/21 0923 98 F (36.7 C)     Temp Source 02/25/21 0923 Oral     SpO2 02/25/21 0923 96 %     Weight 02/25/21 0924 236 lb (107 kg)     Height 02/25/21 0924 5\' 6"  (1.676 m)     Head Circumference --      Peak Flow --      Pain Score 02/25/21 0924 10     Pain Loc --      Pain Edu? --      Excl. in GC? --     Constitutional: Alert and oriented. Well appearing and in no acute distress. Eyes: Conjunctivae are normal.  Head: Atraumatic. Nose: No congestion/rhinnorhea. Mouth/Throat: Mucous membranes are moist.   Neck:  supple no lymphadenopathy noted Cardiovascular: Normal rate, regular rhythm.  Respiratory: Normal respiratory effort.  No retractions,  GU: deferred  Musculoskeletal: FROM all extremities, warm and well perfused, right hand is tender and swollen, patient unable to make a complete fist due to the swelling, neurovascular is intact Neurologic:  Normal speech and language.  Skin:  Skin is warm, dry and intact. No rash noted. Psychiatric: Mood and affect are normal. Speech and behavior are normal.  ____________________________________________   LABS (all labs ordered are listed, but only abnormal results are displayed)  Labs Reviewed  BASIC METABOLIC PANEL  CBC WITH DIFFERENTIAL/PLATELET  URIC ACID    ____________________________________________   ____________________________________________  RADIOLOGY  X-ray of the right hand  ____________________________________________   PROCEDURES  Procedure(s) performed: No  Procedures    ____________________________________________   INITIAL IMPRESSION / ASSESSMENT AND PLAN / ED COURSE  Pertinent labs & imaging results that were available during my care of the patient were reviewed by me and considered in my medical decision making (see chart for details).   Patient's 60 year old male presents right hand swelling.  See HPI.  Physical exam shows patient per stable.  Patient be given Decadron 10 mg IM.  Uric acid, CBC and metabolic panel ordered.  I do feel the patient is having a gout flare that is following giving him the steroid injection.  States he is unable to afford his medicines today. will have money to get his medicines tomorrow  X-ray of the right hand was reviewed by me confirmed by radiology be negative for fracture or erosion   Labs are reassuring, explained everything to the patient.  He is given a dose of his blood pressure medication here in the ED.  Discharged with medications for gout.  Encouraged him to get a medication management as he cannot afford his medications.  Is discharged stable condition.  Gary Webb was evaluated in Emergency Department on 02/25/2021 for the symptoms described in the history of present illness. He was evaluated in the context of the global COVID-19 pandemic, which necessitated consideration that the patient might be at risk for infection with the SARS-CoV-2 virus that causes COVID-19. Institutional protocols and algorithms that pertain to the evaluation of patients at risk for COVID-19 are in a state of rapid change based on information released by regulatory bodies including the CDC and federal and state organizations. These policies and algorithms were followed during the patient's care  in the ED.    As part of my medical decision making, I reviewed the following data within the electronic MEDICAL RECORD NUMBER Nursing notes reviewed and incorporated, Labs reviewed , Old chart reviewed, Radiograph reviewed , Notes from prior ED visits and [place] Controlled Substance Database  ____________________________________________   FINAL CLINICAL IMPRESSION(S) / ED DIAGNOSES  Final diagnoses:  Idiopathic gout, unspecified chronicity, unspecified site      NEW MEDICATIONS STARTED DURING THIS VISIT:  Discharge Medication List as of 02/25/2021 12:31 PM    START taking these medications   Details  predniSONE (DELTASONE) 10 MG tablet Take 6 pills on day one then decrease by 1 pill each day, Normal         Note:  This document was prepared using Dragon voice recognition software and may include unintentional dictation errors.    Faythe Ghee, PA-C 02/25/21 1625    Concha Se, MD 02/25/21 Jerene Bears

## 2021-02-25 NOTE — ED Triage Notes (Signed)
See first RN Note, pt c/o R hand swelling and R wrist pain, denies known injury at this time.

## 2021-02-26 ENCOUNTER — Other Ambulatory Visit: Payer: Self-pay

## 2021-10-10 ENCOUNTER — Other Ambulatory Visit: Payer: Self-pay

## 2021-10-10 ENCOUNTER — Emergency Department: Payer: Medicaid Other

## 2021-10-10 ENCOUNTER — Emergency Department
Admission: EM | Admit: 2021-10-10 | Discharge: 2021-10-10 | Disposition: A | Discharge status: Home / Self Care | Payer: Medicaid Other | Attending: Emergency Medicine | Admitting: Emergency Medicine

## 2021-10-10 DIAGNOSIS — I10 Essential (primary) hypertension: Secondary | ICD-10-CM | POA: Insufficient documentation

## 2021-10-10 DIAGNOSIS — M10021 Idiopathic gout, right elbow: Secondary | ICD-10-CM | POA: Diagnosis not present

## 2021-10-10 DIAGNOSIS — M109 Gout, unspecified: Secondary | ICD-10-CM

## 2021-10-10 DIAGNOSIS — M25521 Pain in right elbow: Secondary | ICD-10-CM | POA: Diagnosis present

## 2021-10-10 HISTORY — DX: Gout, unspecified: M10.9

## 2021-10-10 LAB — BASIC METABOLIC PANEL
Anion gap: 9 (ref 5–15)
BUN: 10 mg/dL (ref 6–20)
CO2: 22 mmol/L (ref 22–32)
Calcium: 9.5 mg/dL (ref 8.9–10.3)
Chloride: 105 mmol/L (ref 98–111)
Creatinine, Ser: 0.83 mg/dL (ref 0.61–1.24)
GFR, Estimated: >60 mL/min (ref >60)
Glucose, Bld: 95 mg/dL (ref 70–99)
Potassium: 3.8 mmol/L (ref 3.5–5.1)
Sodium: 136 mmol/L (ref 135–145)

## 2021-10-10 LAB — TROPONIN I (HIGH SENSITIVITY): Troponin I (High Sensitivity): 17 ng/L (ref <18)

## 2021-10-10 LAB — CBC
HCT: 42.5 % (ref 39.0–52.0)
Hemoglobin: 14.7 g/dL (ref 13.0–17.0)
MCH: 30.8 pg (ref 26.0–34.0)
MCHC: 34.6 g/dL (ref 30.0–36.0)
MCV: 88.9 fL (ref 80.0–100.0)
Platelets: 380 10*3/uL (ref 150–400)
RBC: 4.78 MIL/uL (ref 4.22–5.81)
RDW: 12.1 % (ref 11.5–15.5)
WBC: 7.9 10*3/uL (ref 4.0–10.5)
nRBC: 0 % (ref 0.0–0.2)

## 2021-10-10 MED ORDER — PREDNISONE 10 MG PO TABS: 50.0000 mg | ORAL_TABLET | Freq: Every day | ORAL | 0 refills | Status: DC

## 2021-10-10 MED ORDER — PREDNISONE 20 MG PO TABS
60.0000 mg | ORAL_TABLET | Freq: Once | ORAL | Status: AC
  Administered 2021-10-10: 60 mg via ORAL
  Filled 2021-10-10: qty 3

## 2021-10-10 MED ORDER — CLONIDINE HCL 0.1 MG PO TABS
0.2000 mg | ORAL_TABLET | Freq: Once | ORAL | Status: AC
  Administered 2021-10-10: 0.2 mg via ORAL
  Filled 2021-10-10: qty 2

## 2021-10-10 NOTE — ED Triage Notes (Signed)
See first nurse note, pt reports sent from PCP for lab work d/t HTN, states had chest pain yesterday after moving furniture. Denies cp at this time.  Reports right elbow pain from gout MSE Cari NP in triage Took bp meds this am

## 2021-10-10 NOTE — ED Notes (Signed)
First encounter with pt. Pt verbalized understanding of discharge instructions, medication prescriptions, and follow up care instructions. Pt advised if symptoms worsen to return to emergency room at this time. Pt does have a hx of HTN.

## 2021-10-10 NOTE — ED Provider Triage Note (Signed)
Emergency Medicine Provider Triage Evaluation Note  Gary Webb , a 61 y.o. male  was evaluated in triage.  Pt complains of elevated blood pressure and gout in right elbow. He was sent by his PCP due to hypertension. Patient currently denies chest pain or shortness of breath. Denies injury to elbow.  Review of Systems  Positive: Elbow pain and hypertension Negative: Chest pain or shortness of breath  Physical Exam  BP (!) 183/122    Pulse 88    Temp 99.2 F (37.3 C) (Oral)    Resp 20    Ht 5\' 6"  (1.676 m)    Wt 106.1 kg    SpO2 95%    BMI 37.77 kg/m  Gen:   Awake, no distress   Resp:  Normal effort  MSK:   Moves extremities without difficulty  Other:    Medical Decision Making  Medically screening exam initiated at 3:36 PM.  Appropriate orders placed.  Gary Webb was informed that the remainder of the evaluation will be completed by another provider, this initial triage assessment does not replace that evaluation, and the importance of remaining in the ED until their evaluation is complete.   Gary Pope, FNP 10/10/21 1538

## 2021-10-10 NOTE — ED Provider Notes (Signed)
[facility] Provider Note    Event Date/Time   First MD Initiated Contact with Patient 10/10/21 1609     (approximate)   History   Hypertension and Gout   HPI  Gary Webb is a 61 y.o. male with a history of gout, hypertension, and remaining history as listed in the EMR presents to the emergency department for evaluation of blood pressure elevation despite having taken blood pressure medication as prescribed this morning as well as right elbow pain similar to previous gout. Patient denies chest pain, shortness of breath, or other concerning symptoms.     Physical Exam   Triage Vital Signs: ED Triage Vitals  Enc Vitals Group     BP 10/10/21 1529 (!) 183/122     Pulse Rate 10/10/21 1529 88     Resp 10/10/21 1529 20     Temp 10/10/21 1529 99.2 F (37.3 C)     Temp Source 10/10/21 1529 Oral     SpO2 10/10/21 1529 95 %     Weight 10/10/21 1531 234 lb (106.1 kg)     Height 10/10/21 1531 5\' 6"  (1.676 m)     Head Circumference --      Peak Flow --      Pain Score 10/10/21 1531 4     Pain Loc --      Pain Edu? --      Excl. in [place]? --     Most recent vital signs: Vitals:   10/10/21 1746 10/10/21 1758  BP: (!) 145/93 (!) 145/93  Pulse:  85  Resp:  19  Temp:  98.2 F (36.8 C)  SpO2:  98%     General: Awake, no distress.  CV:  Good peripheral perfusion.  Resp:  Normal effort.  Abd:  No distention.  Other:  Mild swelling over the olecranon of the right arm.  Patient is able to flex and extend at the elbow.   ED Results / Procedures / Treatments   Labs (all labs ordered are listed, but only abnormal results are displayed) Labs Reviewed  BASIC METABOLIC PANEL  CBC  TROPONIN I (HIGH SENSITIVITY)  TROPONIN I (HIGH SENSITIVITY)     EKG  ED ECG REPORT I, Michial Disney, FNP-BC personally viewed and interpreted this ECG.   Date: 10/10/2021  EKG Time: 1533  Rate: 90  Rhythm: normal EKG, normal sinus rhythm  Axis: normal   Intervals:none  ST&T Change: no ST changes    RADIOLOGY Image and radiology report reviewed by me Chest x-ray negative for acute cardiopulmonary abnormality.   PROCEDURES:  Critical Care performed: No  Procedures   MEDICATIONS ORDERED IN ED: Medications  cloNIDine (CATAPRES) tablet 0.2 mg (0.2 mg Oral Given 10/10/21 1645)  predniSONE (DELTASONE) tablet 60 mg (60 mg Oral Given 10/10/21 1802)     IMPRESSION / MDM / ASSESSMENT AND PLAN / ED COURSE  I reviewed the triage vital signs and the nursing notes.  Differential diagnosis includes, but is not limited to, hypertensive urgency, electrolyte imbalance, gout  61 year old male presenting to the emergency department for evaluation of hypertension and gout.  See HPI for further details.  We will get labs, EKG, chest x-ray.  Labs including troponin are all normal.  Chest x-ray is without acute concerns.  Patient is adamant that he have prednisone to relieve gout. 5 day dose written, but advised it may increase his blood pressure.  Blood pressure responded well to clonidine.  Patient will be discharged home and advised  to follow-up with his primary care for further management of his hypertension.  He was encouraged to continue taking his medications as prescribed. He is to return to the ER for symptoms of concern.      FINAL CLINICAL IMPRESSION(S) / ED DIAGNOSES   Final diagnoses:  Primary hypertension  Acute gout of right elbow, unspecified cause     Rx / DC Orders   ED Discharge Orders          Ordered    predniSONE (DELTASONE) 10 MG tablet  Daily        10/10/21 1751             Note:  This document was prepared using Dragon voice recognition software and may include unintentional dictation errors.   Victorino Dike, FNP 10/10/21 2028    Lucrezia Starch, MD 10/10/21 [phone]

## 2021-10-10 NOTE — ED Triage Notes (Signed)
Pt comes into the ED via EMS from home , home provider checked on the pt today and found him to HTN, provider sent for cardiac enzyme levels. A/ox4  Pt c/o right arm pain radiating to the elbow  165/111 HR80's 95-100%RA

## 2022-04-29 IMAGING — CR DG HAND COMPLETE 3+V*R*
1 series · 3 of 3 positions shown · non-contrast
Comparison: Right hand x-rays dated April 22, 2019.

CLINICAL DATA: Right hand and wrist swelling.  No injury.

EXAM:
RIGHT HAND - COMPLETE 3+ VIEW

[Series 1: dg hand complete right · 0.14mm/px · 3 of 3 slices shown]
[im 1/3]
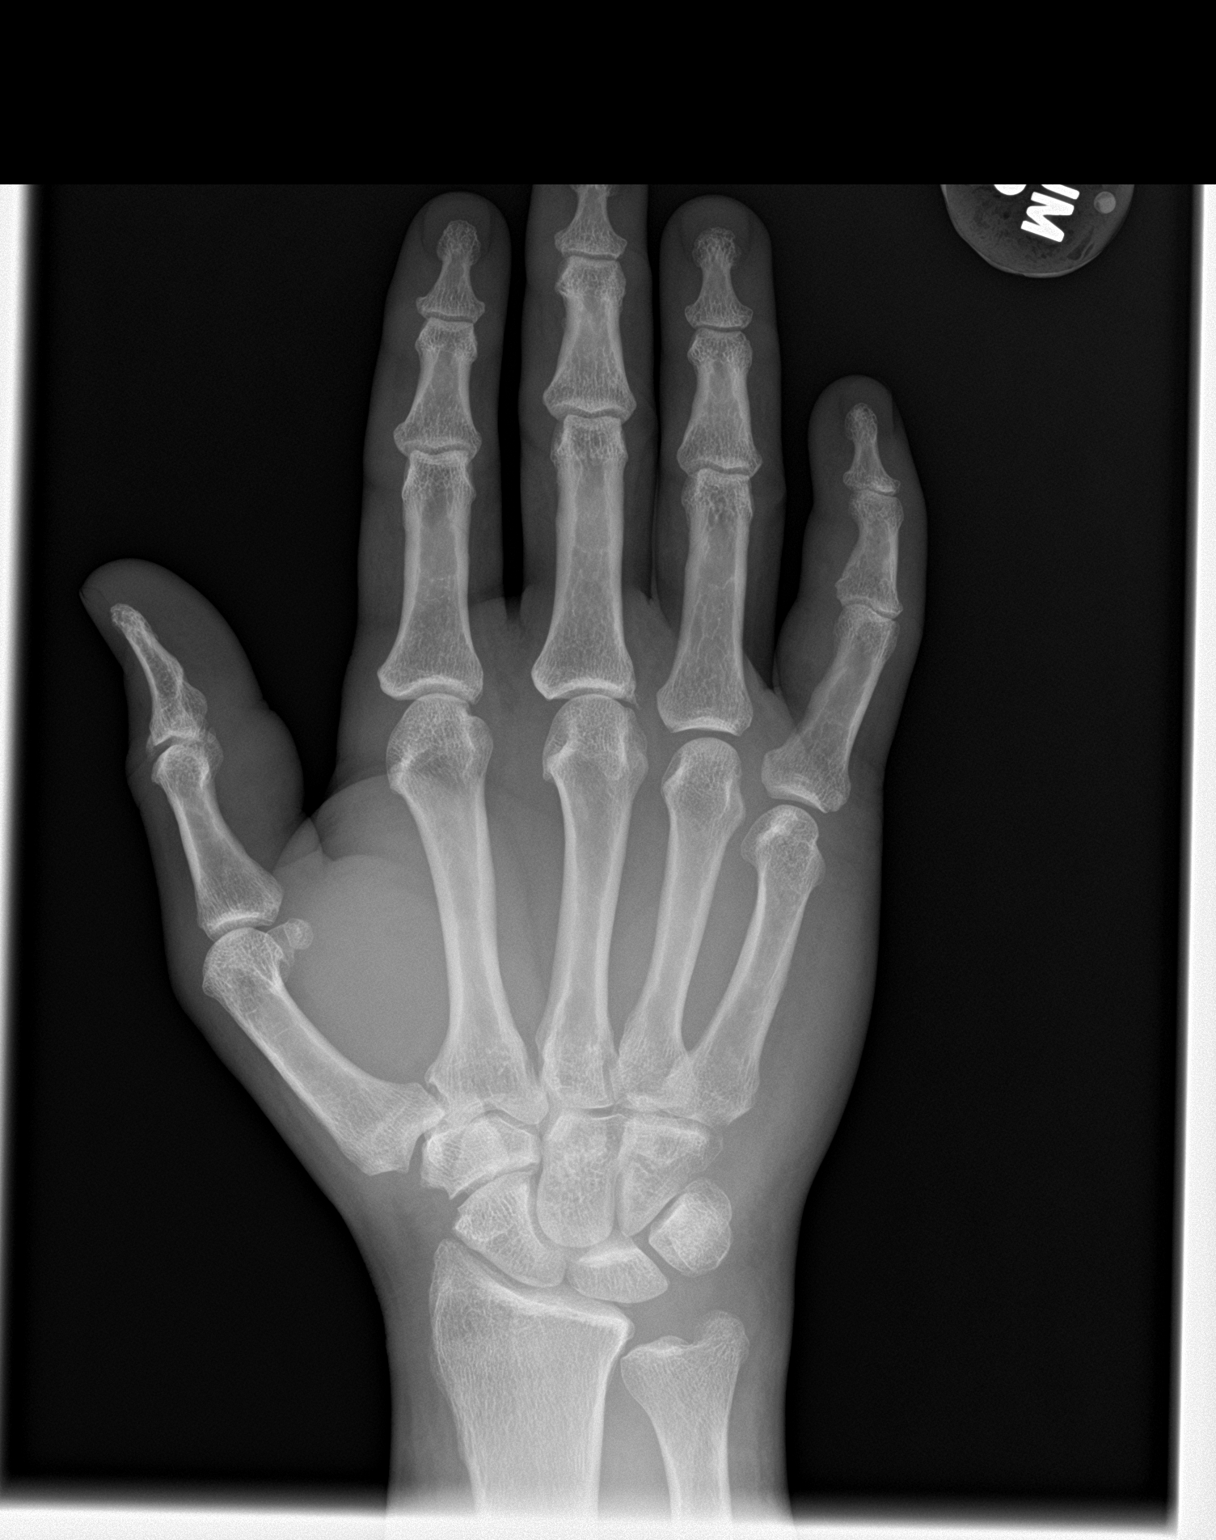
[im 2/3]
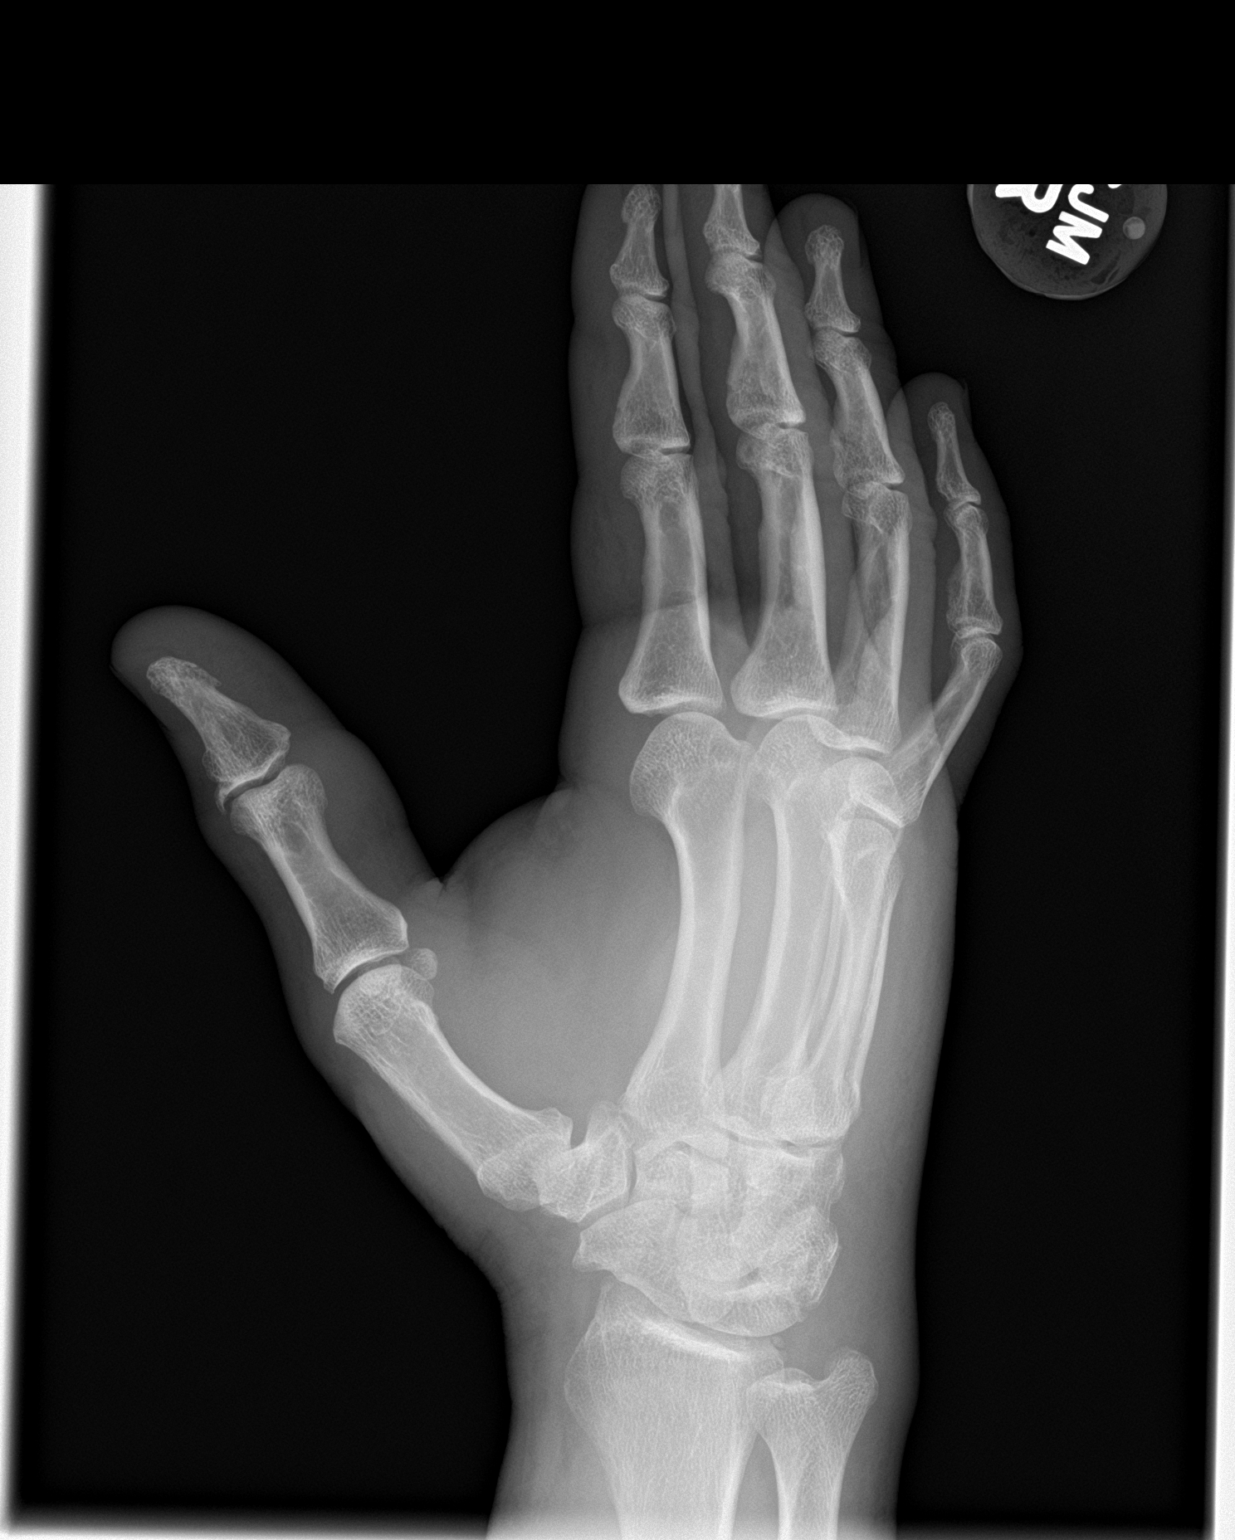
[im 3/3]
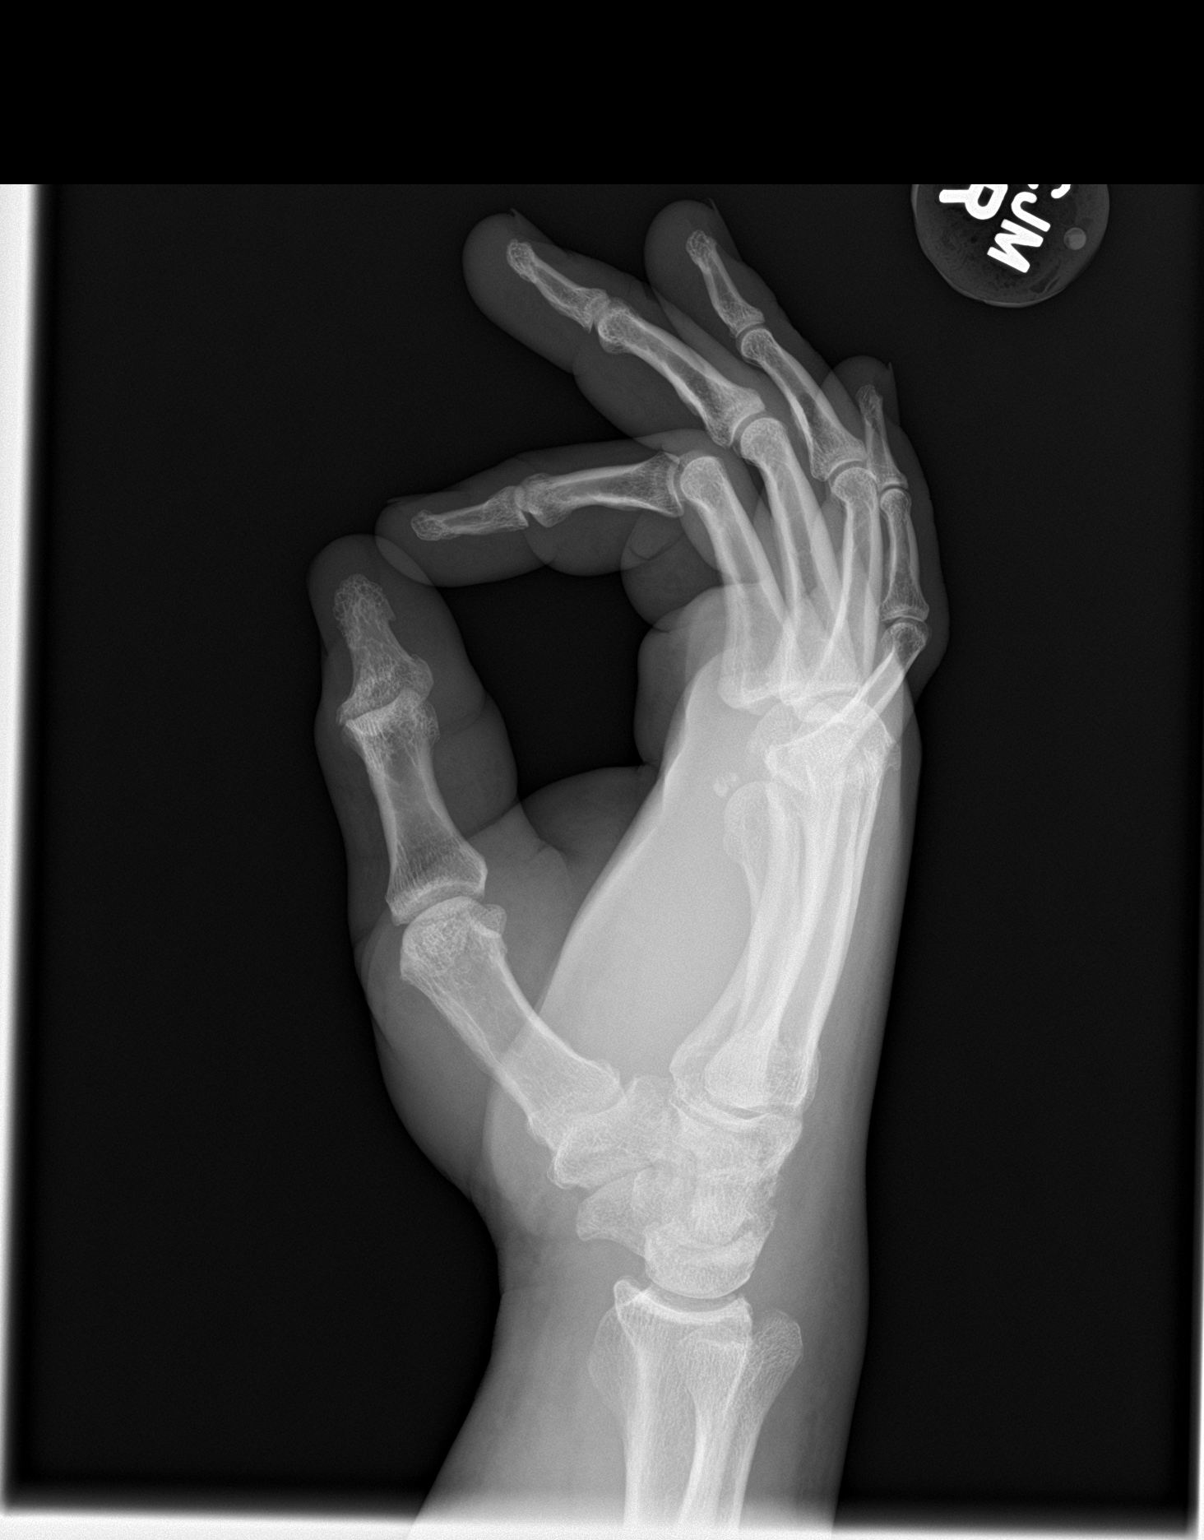

[3 of 3 positions shown; findings below may reference images not displayed]

FINDINGS: No acute fracture or dislocation. Unchanged mild third MCP joint
osteoarthritis. Remaining joint spaces are preserved. No bony
erosions or periosteal reaction. Bone mineralization is normal. Mild
dorsal hand and wrist soft tissue swelling.
IMPRESSION: 1. Mild dorsal hand and wrist soft tissue swelling. No acute osseous
abnormality.

## 2022-07-19 DIAGNOSIS — M1009 Idiopathic gout, multiple sites: Secondary | ICD-10-CM | POA: Diagnosis not present

## 2022-07-19 DIAGNOSIS — Z1389 Encounter for screening for other disorder: Secondary | ICD-10-CM | POA: Diagnosis not present

## 2022-07-19 DIAGNOSIS — I10 Essential (primary) hypertension: Secondary | ICD-10-CM | POA: Diagnosis not present

## 2022-09-01 ENCOUNTER — Other Ambulatory Visit: Payer: Self-pay

## 2022-09-01 ENCOUNTER — Emergency Department: Payer: Medicaid Other

## 2022-09-01 ENCOUNTER — Emergency Department
Admission: EM | Admit: 2022-09-01 | Discharge: 2022-09-01 | Disposition: A | Discharge status: Home / Self Care | Payer: Medicaid Other | Attending: Emergency Medicine | Admitting: Emergency Medicine

## 2022-09-01 DIAGNOSIS — K4090 Unilateral inguinal hernia, without obstruction or gangrene, not specified as recurrent: Secondary | ICD-10-CM | POA: Diagnosis not present

## 2022-09-01 DIAGNOSIS — N5089 Other specified disorders of the male genital organs: Secondary | ICD-10-CM | POA: Diagnosis present

## 2022-09-01 LAB — URINALYSIS, ROUTINE W REFLEX MICROSCOPIC
Bacteria, UA: NONE SEEN
Bilirubin Urine: NEGATIVE
Glucose, UA: NEGATIVE mg/dL
Ketones, ur: NEGATIVE mg/dL
Leukocytes,Ua: NEGATIVE
Nitrite: NEGATIVE
Protein, ur: 100 mg/dL — AB
Specific Gravity, Urine: 1.01 (ref 1.005–1.030)
pH: 6 (ref 5.0–8.0)

## 2022-09-01 NOTE — ED Provider Triage Note (Signed)
Emergency Medicine Provider Triage Evaluation Note  Gary Webb , a 61 y.o. male  was evaluated in triage.  Pt complains of R testicular swelling and pain. Patient started allopurinol and started having testicular pain and swelling afterwards. No dysuria, no hematuria.  Review of Systems  Positive: Testicular pain/swelling Negative: Abd pain, dysuria  Physical Exam  There were no vitals taken for this visit. Gen:   Awake, no distress   Resp:  Normal effort  MSK:   Moves extremities without difficulty  Other:    Medical Decision Making  Medically screening exam initiated at 3:52 PM.  Appropriate orders placed.  Christopherjame Carnell was informed that the remainder of the evaluation will be completed by another provider, this initial triage assessment does not replace that evaluation, and the importance of remaining in the ED until their evaluation is complete.  Testicular pain and swelling. [place] and urine   Racheal Patches, PA-C 09/01/22 1552

## 2022-09-01 NOTE — ED Provider Notes (Signed)
[facility] Provider Note  Patient Contact: 6:39 PM (approximate)   History   Groin Swelling   HPI  Gary Webb is a 61 y.o. male presents to the emergency department with scrotal swelling on the right for the past 2 weeks.  Patient denies significant pain, only endorsing some mild soreness.  No concerns for STDs.  No chest pain, chest tightness, abdominal pain, nausea or vomiting.      Physical Exam   Triage Vital Signs: ED Triage Vitals  Enc Vitals Group     BP 09/01/22 1553 (!) 184/114     Pulse Rate 09/01/22 1553 81     Resp 09/01/22 1553 16     Temp 09/01/22 1553 97.8 F (36.6 C)     Temp Source 09/01/22 1553 Oral     SpO2 09/01/22 1553 98 %     Weight 09/01/22 1554 234 lb (106.1 kg)     Height 09/01/22 1554 5\' 6"  (1.676 m)     Head Circumference --      Peak Flow --      Pain Score 09/01/22 1553 3     Pain Loc --      Pain Edu? --      Excl. in [place]? --     Most recent vital signs: Vitals:   09/01/22 1553  BP: (!) 184/114  Pulse: 81  Resp: 16  Temp: 97.8 F (36.6 C)  SpO2: 98%     General: Alert and in no acute distress. Eyes:  PERRL. EOMI. Head: No acute traumatic findings ENT:      Nose: No congestion/rhinnorhea.      Mouth/Throat: Mucous membranes are moist.  Neck: No stridor. No cervical spine tenderness to palpation. Cardiovascular:  Good peripheral perfusion Respiratory: Normal respiratory effort without tachypnea or retractions. Lungs CTAB. Good air entry to the bases with no decreased or absent breath sounds. Gastrointestinal: Bowel sounds 4 quadrants. Soft and nontender to palpation. No guarding or rigidity. No palpable masses. No distention. No CVA tenderness. Genitourinary: Patient has a large right scrotum with hyperactive bowel sounds present. Musculoskeletal: Full range of motion to all extremities.  Neurologic:  No gross focal neurologic deficits are appreciated.  Skin:   No rash noted Other:   ED  Results / Procedures / Treatments   Labs (all labs ordered are listed, but only abnormal results are displayed) Labs Reviewed  URINALYSIS, ROUTINE W REFLEX MICROSCOPIC - Abnormal; Notable for the following components:      Result Value   Color, Urine YELLOW (*)    APPearance CLEAR (*)    Hgb urine dipstick SMALL (*)    Protein, ur 100 (*)    All other components within normal limits  CHLAMYDIA/NGC RT PCR ([facility] ONLY)                RADIOLOGY  I personally viewed and evaluated these images as part of my medical decision making, as well as reviewing the written report by the radiologist.  ED Provider Interpretation: Patient has a large right inguinal hernia with active peristalsis.   PROCEDURES:  Critical Care performed: No  Procedures   MEDICATIONS ORDERED IN ED: Medications - No data to display   IMPRESSION / MDM / ASSESSMENT AND PLAN / ED COURSE  I reviewed the triage vital signs and the nursing notes.  Assessment and plan Groin pain 61 year old male presents to the emergency department with testicular soreness and swelling.  Patient was hypertensive at triage but vital signs otherwise reassuring.  Patient was alert and nontoxic-appearing.  Patient had swelling of the right testicle with evidence of large inguinal hernia on ultrasound with no evidence of torsion.  Patient had active peristalsis on ultrasound.  I reached out to general surgeon on-call, Dr. Maia Plan.  Very much appreciate time and consult.  Dr. Maia Plan feels that patient is appropriate for outpatient follow-up at this time.  Return precautions were given to return with new or worsening symptoms.  All patient questions were answered.   FINAL CLINICAL IMPRESSION(S) / ED DIAGNOSES   Final diagnoses:  Unilateral inguinal hernia without obstruction or gangrene, recurrence not specified     Rx / DC Orders   ED Discharge Orders     None        Note:  This document  was prepared using Dragon voice recognition software and may include unintentional dictation errors.   Pia Mau [place], PA-C 09/01/22 1853    Sharman Cheek, MD 09/01/22 2308

## 2022-09-01 NOTE — ED Notes (Signed)
First Nurse Note: Pt to ED via POV c/o testicle pain. Pt is in NAD.

## 2022-09-01 NOTE — ED Triage Notes (Signed)
Pt to ED for R testicle swelling since 2 weeks, which started after started taking allopurinol and amlodipine. Testicle started aching last night. Able to walk. Pt states has been hypertensive and needs BP meds adjusted and that Phineas Real todl him ER has to make the med adjustment.

## 2022-09-01 NOTE — Discharge Instructions (Signed)
Please make follow up appointment with Dr. Maia Plan.

## 2022-09-02 LAB — CHLAMYDIA/NGC RT PCR (ARMC ONLY)
Chlamydia Tr: NOT DETECTED
N gonorrhoeae: NOT DETECTED

## 2022-09-09 ENCOUNTER — Ambulatory Visit: Payer: Self-pay | Admitting: General Surgery

## 2022-09-09 NOTE — H&P (Signed)
PATIENT PROFILE: Gary Webb is a 61 y.o. male who presents to the Clinic for consultation at the request of Dr. Scotty Court for evaluation of right inguinal hernia hernia.  PCP:  [facility]  HISTORY OF PRESENT ILLNESS: Mr. Gary Webb reports went to the emergency room due to swelling of the right groin.  He was having pain in the right groin due to the swelling.  Pain localized to the right groin.  No pain radiation.  Pain aggravated by certain abdominal wall movement.  Alleviating factor is resting.  Patient denies any episode of abdominal distention, nausea or vomiting.  At the ED he had ultrasound that shows a large inguinal hernia with small bowel.   PROBLEM LIST: Inguinal hernia  GENERAL REVIEW OF SYSTEMS:   General ROS: negative for - chills, fatigue, fever, weight gain or weight loss Allergy and Immunology ROS: negative for - hives  Hematological and Lymphatic ROS: negative for - bleeding problems or bruising, negative for palpable nodes Endocrine ROS: negative for - heat or cold intolerance, hair changes Respiratory ROS: negative for - cough, shortness of breath or wheezing Cardiovascular ROS: no chest pain or palpitations GI ROS: negative for nausea, vomiting, abdominal pain, diarrhea, constipation Musculoskeletal ROS: negative for - joint swelling or muscle pain Neurological ROS: negative for - confusion, syncope Dermatological ROS: negative for pruritus and rash Psychiatric: negative for anxiety, depression, difficulty sleeping and memory loss  MEDICATIONS: Current Outpatient Medications  Medication Sig Dispense Refill   allopurinoL (ZYLOPRIM) 300 MG tablet Take 1 tablet by mouth once daily     aspirin 81 MG EC tablet Take 81 mg by mouth once daily     indomethacin (INDOCIN) 25 MG capsule Take 25 mg by mouth 2 (two) times daily with meals     lisinopriL (ZESTRIL) 20 MG tablet Take 20 mg by mouth once daily     predniSONE (DELTASONE) 10 MG tablet Take  50 mg by mouth once daily     rosuvastatin (CRESTOR) 10 MG tablet Take 10 mg by mouth once daily     No current facility-administered medications for this visit.    ALLERGIES: No known drug allergy  PAST MEDICAL HISTORY: Past Medical History:  Diagnosis Date   Gout    HTN (hypertension)     PAST SURGICAL HISTORY: History reviewed. No pertinent surgical history.   FAMILY HISTORY: History reviewed. No pertinent family history.   SOCIAL HISTORY: Social History   Socioeconomic History   Marital status: Single  Tobacco Use   Smoking status: Every Day    Types: Cigars    Passive exposure: Never   Smokeless tobacco: Never  Vaping Use   Vaping Use: Never used  Substance and Sexual Activity   Alcohol use: Yes    Comment: occasionally   Drug use: Never    PHYSICAL EXAM: Vitals:   09/09/22 1307  BP: (!) 182/115  Pulse: 105   Body mass index is 38.74 kg/m. Weight: (!) 108.9 kg (240 lb)   GENERAL: Alert, active, oriented x3  HEENT: Pupils equal reactive to light. Extraocular movements are intact. Sclera clear. Palpebral conjunctiva normal red color.Pharynx clear.  NECK: Supple with no palpable mass and no adenopathy.  LUNGS: Sound clear with no rales rhonchi or wheezes.  HEART: Regular rhythm S1 and S2 without murmur.  ABDOMEN: Soft and depressible, nontender with no palpable mass, no hepatomegaly.  Large right inguinal hernia.,  Partially reducible.  EXTREMITIES: Well-developed well-nourished symmetrical with no dependent edema.  NEUROLOGICAL: Awake  alert oriented, facial expression symmetrical, moving all extremities.  REVIEW OF DATA: I have reviewed the following data today: Initial consult on 09/09/2022  Component Date Value   WBC (White Blood Cell Co* 09/09/2022 6.8    RBC (Red Blood Cell Coun* 09/09/2022 5.09    Hemoglobin 09/09/2022 15.8    Hematocrit 09/09/2022 45.8    MCV (Mean Corpuscular Vo* 09/09/2022 90.0    MCH (Mean Corpuscular He*  09/09/2022 31.0    MCHC (Mean Corpuscular H* 09/09/2022 34.5    Platelet Count 09/09/2022 344    RDW-CV (Red Cell Distrib* 09/09/2022 12.5    MPV (Mean Platelet Volum* 09/09/2022 9.7    Neutrophils 09/09/2022 3.52    Lymphocytes 09/09/2022 2.52    Monocytes 09/09/2022 0.57    Eosinophils 09/09/2022 0.10    Basophils 09/09/2022 0.02    Neutrophil % 09/09/2022 52.0    Lymphocyte % 09/09/2022 37.2    Monocyte % 09/09/2022 8.4    Eosinophil % 09/09/2022 1.5    Basophil% 09/09/2022 0.3    Immature Granulocyte % 09/09/2022 0.6    Immature Granulocyte Cou* 09/09/2022 0.04      ASSESSMENT: Gary Webb is a 61 y.o. male presenting for consultation for right versus bilateral inguinal hernia.    The patient presents with a symptomatic, right versus bilateral inguinal hernia. Patient was oriented about the diagnosis of inguinal hernia and its implication. The patient was oriented about the treatment alternatives (observation vs surgical repair). Due to patient symptoms, repair is recommended. Patient oriented about the surgical procedure, the use of mesh and its risk of complications such as: infection, bleeding, injury to vas deference, vasculature and testicle, injury to bowel or bladder, and chronic pain.   Non-recurrent bilateral inguinal hernia without obstruction or gangrene [K40.20]  PLAN: 1.  Robotic assisted laparoscopic right vs bilateral inguinal hernia repair with mesh (19622) 2.  CBC, CMP 3.  Avoid taking aspirin 5 days before procedure 4.  Encourage to stop smoking 5.  Contact us if has any question or concern.   Patient verbalized understanding, all questions were answered, and were agreeable with the plan outlined above.    Carolan Shiver, MD  Electronically signed by Carolan Shiver, MD

## 2022-09-09 NOTE — H&P (View-Only) (Signed)
PATIENT PROFILE: Gary Webb is a 61 y.o. male who presents to the Clinic for consultation at the request of Dr. Scotty Court for evaluation of right inguinal hernia hernia.  PCP:  [facility]  HISTORY OF PRESENT ILLNESS: [name] reports went to the emergency room due to swelling of the right groin.  He was having pain in the right groin due to the swelling.  Pain localized to the right groin.  No pain radiation.  Pain aggravated by certain abdominal wall movement.  Alleviating factor is resting.  Patient denies any episode of abdominal distention, nausea or vomiting.  At the ED he had ultrasound that shows a large inguinal hernia with small bowel.   PROBLEM LIST: Inguinal hernia  GENERAL REVIEW OF SYSTEMS:   General ROS: negative for - chills, fatigue, fever, weight gain or weight loss Allergy and Immunology ROS: negative for - hives  Hematological and Lymphatic ROS: negative for - bleeding problems or bruising, negative for palpable nodes Endocrine ROS: negative for - heat or cold intolerance, hair changes Respiratory ROS: negative for - cough, shortness of breath or wheezing Cardiovascular ROS: no chest pain or palpitations GI ROS: negative for nausea, vomiting, abdominal pain, diarrhea, constipation Musculoskeletal ROS: negative for - joint swelling or muscle pain Neurological ROS: negative for - confusion, syncope Dermatological ROS: negative for pruritus and rash Psychiatric: negative for anxiety, depression, difficulty sleeping and memory loss  MEDICATIONS: Current Outpatient Medications  Medication Sig Dispense Refill   allopurinoL (ZYLOPRIM) 300 MG tablet Take 1 tablet by mouth once daily     aspirin 81 MG EC tablet Take 81 mg by mouth once daily     indomethacin (INDOCIN) 25 MG capsule Take 25 mg by mouth 2 (two) times daily with meals     lisinopriL (ZESTRIL) 20 MG tablet Take 20 mg by mouth once daily     predniSONE (DELTASONE) 10 MG tablet Take  50 mg by mouth once daily     rosuvastatin (CRESTOR) 10 MG tablet Take 10 mg by mouth once daily     No current facility-administered medications for this visit.    ALLERGIES: No known drug allergy  PAST MEDICAL HISTORY: Past Medical History:  Diagnosis Date   Gout    HTN (hypertension)     PAST SURGICAL HISTORY: History reviewed. No pertinent surgical history.   FAMILY HISTORY: History reviewed. No pertinent family history.   SOCIAL HISTORY: Social History   Socioeconomic History   Marital status: Single  Tobacco Use   Smoking status: Every Day    Types: Cigars    Passive exposure: Never   Smokeless tobacco: Never  Vaping Use   Vaping Use: Never used  Substance and Sexual Activity   Alcohol use: Yes    Comment: occasionally   Drug use: Never    PHYSICAL EXAM: Vitals:   09/09/22 1307  BP: (!) 182/115  Pulse: 105   Body mass index is 38.74 kg/m. Weight: (!) 108.9 kg (240 lb)   GENERAL: Alert, active, oriented x3  HEENT: Pupils equal reactive to light. Extraocular movements are intact. Sclera clear. Palpebral conjunctiva normal red color.Pharynx clear.  NECK: Supple with no palpable mass and no adenopathy.  LUNGS: Sound clear with no rales rhonchi or wheezes.  HEART: Regular rhythm S1 and S2 without murmur.  ABDOMEN: Soft and depressible, nontender with no palpable mass, no hepatomegaly.  Large right inguinal hernia.,  Partially reducible.  EXTREMITIES: Well-developed well-nourished symmetrical with no dependent edema.  NEUROLOGICAL: Awake  alert oriented, facial expression symmetrical, moving all extremities.  REVIEW OF DATA: I have reviewed the following data today: Initial consult on 09/09/2022  Component Date Value   WBC (White Blood Cell Co* 09/09/2022 6.8    RBC (Red Blood Cell Coun* 09/09/2022 5.09    Hemoglobin 09/09/2022 15.8    Hematocrit 09/09/2022 45.8    MCV (Mean Corpuscular Vo* 09/09/2022 90.0    MCH (Mean Corpuscular He*  09/09/2022 31.0    MCHC (Mean Corpuscular H* 09/09/2022 34.5    Platelet Count 09/09/2022 344    RDW-CV (Red Cell Distrib* 09/09/2022 12.5    MPV (Mean Platelet Volum* 09/09/2022 9.7    Neutrophils 09/09/2022 3.52    Lymphocytes 09/09/2022 2.52    Monocytes 09/09/2022 0.57    Eosinophils 09/09/2022 0.10    Basophils 09/09/2022 0.02    Neutrophil % 09/09/2022 52.0    Lymphocyte % 09/09/2022 37.2    Monocyte % 09/09/2022 8.4    Eosinophil % 09/09/2022 1.5    Basophil% 09/09/2022 0.3    Immature Granulocyte % 09/09/2022 0.6    Immature Granulocyte Cou* 09/09/2022 0.04      ASSESSMENT: [name] is a 61 y.o. male presenting for consultation for right versus bilateral inguinal hernia.    The patient presents with a symptomatic, right versus bilateral inguinal hernia. Patient was oriented about the diagnosis of inguinal hernia and its implication. The patient was oriented about the treatment alternatives (observation vs surgical repair). Due to patient symptoms, repair is recommended. Patient oriented about the surgical procedure, the use of mesh and its risk of complications such as: infection, bleeding, injury to vas deference, vasculature and testicle, injury to bowel or bladder, and chronic pain.   Non-recurrent bilateral inguinal hernia without obstruction or gangrene [K40.20]  PLAN: 1.  Robotic assisted laparoscopic right vs bilateral inguinal hernia repair with mesh (19622) 2.  CBC, CMP 3.  Avoid taking aspirin 5 days before procedure 4.  Encourage to stop smoking 5.  Contact us if has any question or concern.   Patient verbalized understanding, all questions were answered, and were agreeable with the plan outlined above.    Carolan Shiver, MD  Electronically signed by Carolan Shiver, MD

## 2022-09-24 ENCOUNTER — Inpatient Hospital Stay
Admission: RE | Admit: 2022-09-24 | Discharge: 2022-09-24 | Disposition: A | Discharge status: Home / Self Care | Payer: Disability Insurance | Source: Ambulatory Visit

## 2022-09-24 HISTORY — DX: Unilateral inguinal hernia, without obstruction or gangrene, not specified as recurrent: K40.90

## 2022-09-24 HISTORY — DX: Hyperlipidemia, unspecified: E78.5

## 2022-09-24 NOTE — Pre-Procedure Instructions (Signed)
Attempted to call patient x 3 , Left voicemail to call me back to go over instructions for pre surgery. No Show for today.

## 2022-09-24 NOTE — Patient Instructions (Signed)
Your procedure is scheduled on: Wednesday October 01, 2022. Report to Day Surgery inside [facility] 2nd floor, stop by registration desk before getting on elevator.  To find out your arrival time please call [phone] between 1PM - 3PM on Tuesday September 30, 2022.  Remember: Instructions that are not followed completely may result in serious medical risk,  up to and including death, or upon the discretion of your surgeon and anesthesiologist your  surgery may need to be rescheduled.     _X__ 1. Do not eat food or drink fluids after midnight the night before your procedure.                 No chewing gum or hard candies.   __X__2.  On the morning of surgery brush your teeth with toothpaste and water, you                may rinse your mouth with mouthwash if you wish.  Do not swallow any toothpaste or mouthwash.     _X__ 3.  No Alcohol for 24 hours before or after surgery.   _X__ 4.  Do Not Smoke or use e-cigarettes For 24 Hours Prior to Your Surgery.                 Do not use any chewable tobacco products for at least 6 hours prior to                 Surgery.  _X__  5.  Do not use any recreational drugs (marijuana, cocaine, heroin, ecstasy, MDMA or other)                For at least one week prior to your surgery.  Combination of these drugs with anesthesia                May have life threatening results.  ____  6.  Bring all medications with you on the day of surgery if instructed.   __X__  7.  Notify your doctor if there is any change in your medical condition      (cold, fever, infections).     Do not wear jewelry, make-up, hairpins, clips or nail polish. Do not wear lotions, powders, or perfumes. You may wear deodorant. Do not shave 48 hours prior to surgery. Men may shave face and neck. Do not bring valuables to the hospital.    [facility] is not responsible for any belongings or valuables.  Contacts, dentures or bridgework may not be worn into  surgery. Leave your suitcase in the car. After surgery it may be brought to your room. For patients admitted to the hospital, discharge time is determined by your treatment team.   Patients discharged the day of surgery will not be allowed to drive home.   Make arrangements for someone to be with you for the first 24 hours of your Same Day Discharge.   __X__ Take these medicines the morning of surgery with A SIP OF WATER:    1. allopurinol (ZYLOPRIM) 300 MG   2. rosuvastatin (CRESTOR) 10 MG   3.   4.  5.  6.  ____ Fleet Enema (as directed)   __X__ Use CHG Soap (or wipes) as directed  ____ Use Benzoyl Peroxide Gel as instructed  ____ Use inhalers on the day of surgery  ____ Stop metformin 2 days prior to surgery    ____ Take 1/2 of usual insulin dose the night before surgery. No insulin  the morning          of surgery.   ____ Call your PCP, cardiologist, or Pulmonologist if taking Coumadin/Plavix/aspirin and ask when to stop before your surgery.   __X__ One Week prior to surgery- Stop Anti-inflammatories such as Ibuprofen, Aleve, Advil, Motrin, meloxicam (MOBIC), diclofenac, etodolac, ketorolac, Toradol, Daypro, piroxicam, Goody's or BC powders. OK TO USE TYLENOL IF NEEDED   __X__ Stop supplements until after surgery.    ____ Bring C-Pap to the hospital.    If you have any questions regarding your pre-procedure instructions,  Please call Pre-admit Testing at [phone]    Preparing for Surgery with CHLORHEXIDINE GLUCONATE (CHG) Soap  Chlorhexidine Gluconate (CHG) Soap  o An antiseptic cleaner that kills germs and bonds with the skin to continue killing germs even after washing  o Used for showering the night before surgery and morning of surgery  Before surgery, you can play an important role by reducing the number of germs on your skin.  CHG (Chlorhexidine gluconate) soap is an antiseptic cleanser which kills germs and bonds with the skin to continue killing  germs even after washing.  Please do not use if you have an allergy to CHG or antibacterial soaps. If your skin becomes reddened/irritated stop using the CHG.  1. Shower the NIGHT BEFORE SURGERY and the MORNING OF SURGERY with CHG soap.  2. If you choose to wash your hair, wash your hair first as usual with your normal shampoo.  3. After shampooing, rinse your hair and body thoroughly to remove the shampoo.  4. Use CHG as you would any other liquid soap. You can apply CHG directly to the skin and wash gently with a scrungie or a clean washcloth.  5. Apply the CHG soap to your body only from the neck down. Do not use on open wounds or open sores. Avoid contact with your eyes, ears, mouth, and genitals (private parts). Wash face and genitals (private parts) with your normal soap.  6. Wash thoroughly, paying special attention to the area where your surgery will be performed.  7. Thoroughly rinse your body with warm water.  8. Do not shower/wash with your normal soap after using and rinsing off the CHG soap.  9. Pat yourself dry with a clean towel.  10. Wear clean pajamas to bed the night before surgery.  12. Place clean sheets on your bed the night of your first shower and do not sleep with pets.  13. Shower again with the CHG soap on the day of surgery prior to arriving at the hospital.  14. Do not apply any deodorants/lotions/powders.  15. Please wear clean clothes to the hospital.

## 2022-09-29 ENCOUNTER — Inpatient Hospital Stay
Admission: RE | Admit: 2022-09-29 | Discharge: 2022-09-29 | Disposition: A | Discharge status: Home / Self Care | Payer: Disability Insurance | Source: Ambulatory Visit

## 2022-09-29 NOTE — Pre-Procedure Instructions (Signed)
Patient has PAT appointment for today, attempted x 2 and left voice mail for a return call, no return call today, will attempted again tomorrow to reach patient for his pre-op interview and to read over his pre-op instructions.

## 2022-09-29 NOTE — Patient Instructions (Signed)
Your procedure is scheduled on: Report to the Registration Desk on the 1st floor [facility]. To find out your arrival time, please call [phone] between 1PM - 3PM on: If your arrival time is 6:00 am, do not arrive prior to that time as the [facility] entrance doors do not open until 6:00 am.  REMEMBER: Instructions that are not followed completely may result in serious medical risk, up to and including death; or upon the discretion of your surgeon and anesthesiologist your surgery may need to be rescheduled.  Do not eat food after midnight the night before surgery.  No gum chewing, lozengers or hard candies.  You may however, drink CLEAR liquids up to 2 hours before you are scheduled to arrive for your surgery. Do not drink anything within 2 hours of your scheduled arrival time.  Clear liquids include: - water  - apple juice without pulp - gatorade (not RED colors) - black coffee or tea (Do NOT add milk or creamers to the coffee or tea) Do NOT drink anything that is not on this list.  Type 1 and Type 2 diabetics should only drink water.  In addition, your doctor has ordered for you to drink the provided  Ensure Pre-Surgery Clear Carbohydrate Drink  Gatorade G2 Drinking this carbohydrate drink up to two hours before surgery helps to reduce insulin resistance and improve patient outcomes. Please complete drinking 2 hours prior to scheduled arrival time.  TAKE THESE MEDICATIONS THE MORNING OF SURGERY WITH A SIP OF WATER:  (take one the night before and one on the morning of surgery - helps to prevent nausea after surgery.)  Use inhalers on the day of surgery and bring to the hospital.  **Follow guidelines for insulin and diabetes medications.**  Follow recommendations from Cardiologist or PCP regarding stopping Aspirin, Coumadin, Plavix, Eliquis, Pradaxa, or Pletal.  One week prior to surgery: Stop Anti-inflammatories (NSAIDS) such as Advil, Aleve, Ibuprofen,  Motrin, Naproxen, Naprosyn and Aspirin based products such as Excedrin, Goodys Powder, BC Powder. Stop ANY OVER THE COUNTER supplements until after surgery. You may however, continue to take Tylenol if needed for pain up until the day of surgery.  No Alcohol for 24 hours before or after surgery.  No Smoking including e-cigarettes for 24 hours prior to surgery.  No chewable tobacco products for at least 6 hours prior to surgery.  No nicotine patches on the day of surgery.  Do not use any "recreational" drugs for at least a week prior to your surgery.  Please be advised that the combination of cocaine and anesthesia may have negative outcomes, up to and including death. If you test positive for cocaine, your surgery will be cancelled.  On the morning of surgery brush your teeth with toothpaste and water, you may rinse your mouth with mouthwash if you wish. Do not swallow any toothpaste or mouthwash.  Use CHG Soap or wipes as directed on instruction sheet.  Do not wear jewelry, make-up, hairpins, clips or nail polish.  Do not wear lotions, powders, or perfumes.   Do not shave body from the neck down 48 hours prior to surgery just in case you cut yourself which could leave a site for infection.  Also, freshly shaved skin may become irritated if using the CHG soap.  Contact lenses, hearing aids and dentures may not be worn into surgery.  Do not bring valuables to the hospital. [facility] is not responsible for any missing/lost belongings or valuables.  Notify your doctor if there is any change in your medical condition (cold, fever, infection).  Wear comfortable clothing (specific to your surgery type) to the hospital.  After surgery, you can help prevent lung complications by doing breathing exercises.  Take deep breaths and cough every 1-2 hours. Your doctor may order a device called an Incentive Spirometer to help you take deep breaths. When coughing or sneezing, hold a pillow  firmly against your incision with both hands. This is called "splinting." Doing this helps protect your incision. It also decreases belly discomfort.  If you are being admitted to the hospital overnight, leave your suitcase in the car. After surgery it may be brought to your room.  If you are being discharged the day of surgery, you will not be allowed to drive home. You will need a responsible adult (18 years or older) to drive you home and stay with you that night.   If you are taking public transportation, you will need to have a responsible adult (18 years or older) with you. Please confirm with your physician that it is acceptable to use public transportation.   Please call the [place] Dept. at [phone] if you have any questions about these instructions.  Surgery Visitation Policy:  Patients undergoing a surgery or procedure may have two family members or support persons with them as long as the person is not COVID-19 positive or experiencing its symptoms.   Inpatient Visitation:    Visiting hours are 7 a.m. to 8 p.m. Up to four visitors are allowed at one time in a patient room. The visitors may rotate out with other people during the day. One designated support person (adult) may remain overnight.  Due to an increase in RSV and influenza rates and associated hospitalizations, children ages 44 and under will not be able to visit patients in [facility]. Masks continue to be strongly recommended.

## 2022-09-30 ENCOUNTER — Encounter
Admission: RE | Admit: 2022-09-30 | Discharge: 2022-09-30 | Disposition: A | Discharge status: Home / Self Care | Payer: Disability Insurance | Source: Ambulatory Visit | Attending: General Surgery | Admitting: General Surgery

## 2022-09-30 DIAGNOSIS — Z01812 Encounter for preprocedural laboratory examination: Secondary | ICD-10-CM

## 2022-10-01 ENCOUNTER — Ambulatory Visit: Payer: Medicaid Other

## 2022-10-01 ENCOUNTER — Other Ambulatory Visit: Payer: Self-pay

## 2022-10-01 ENCOUNTER — Ambulatory Visit
Admission: RE | Admit: 2022-10-01 | Discharge: 2022-10-01 | Disposition: A | Discharge status: Home / Self Care | Payer: Medicaid Other | Source: Ambulatory Visit | Attending: General Surgery | Admitting: General Surgery

## 2022-10-01 ENCOUNTER — Encounter: Payer: Self-pay | Admitting: General Surgery

## 2022-10-01 ENCOUNTER — Encounter
Admission: RE | Disposition: A | Discharge status: Home / Self Care | Payer: Self-pay | Source: Ambulatory Visit | Attending: General Surgery

## 2022-10-01 ENCOUNTER — Ambulatory Visit: Payer: Medicaid Other | Admitting: Urgent Care

## 2022-10-01 DIAGNOSIS — Z7982 Long term (current) use of aspirin: Secondary | ICD-10-CM | POA: Insufficient documentation

## 2022-10-01 DIAGNOSIS — K4020 Bilateral inguinal hernia, without obstruction or gangrene, not specified as recurrent: Secondary | ICD-10-CM | POA: Insufficient documentation

## 2022-10-01 DIAGNOSIS — E785 Hyperlipidemia, unspecified: Secondary | ICD-10-CM | POA: Insufficient documentation

## 2022-10-01 DIAGNOSIS — F17290 Nicotine dependence, other tobacco product, uncomplicated: Secondary | ICD-10-CM | POA: Diagnosis not present

## 2022-10-01 DIAGNOSIS — Z79899 Other long term (current) drug therapy: Secondary | ICD-10-CM | POA: Insufficient documentation

## 2022-10-01 DIAGNOSIS — I10 Essential (primary) hypertension: Secondary | ICD-10-CM | POA: Insufficient documentation

## 2022-10-01 DIAGNOSIS — I491 Atrial premature depolarization: Secondary | ICD-10-CM | POA: Diagnosis not present

## 2022-10-01 DIAGNOSIS — Z7952 Long term (current) use of systemic steroids: Secondary | ICD-10-CM | POA: Insufficient documentation

## 2022-10-01 DIAGNOSIS — M109 Gout, unspecified: Secondary | ICD-10-CM | POA: Diagnosis not present

## 2022-10-01 DIAGNOSIS — Z01812 Encounter for preprocedural laboratory examination: Secondary | ICD-10-CM

## 2022-10-01 HISTORY — PX: INSERTION OF MESH: SHX5868

## 2022-10-01 SURGERY — REPAIR, HERNIA, INGUINAL, BILATERAL, ROBOT-ASSISTED
Anesthesia: General | Site: Groin | Laterality: Bilateral

## 2022-10-01 MED ORDER — ROCURONIUM BROMIDE 10 MG/ML (PF) SYRINGE
PREFILLED_SYRINGE | INTRAVENOUS | Status: DC | PRN
  Administered 2022-10-01: 10 mg via INTRAVENOUS
  Administered 2022-10-01: 20 mg via INTRAVENOUS
  Administered 2022-10-01: 60 mg via INTRAVENOUS
  Administered 2022-10-01 (×2): 10 mg via INTRAVENOUS

## 2022-10-01 MED ORDER — SUGAMMADEX SODIUM 200 MG/2ML IV SOLN
INTRAVENOUS | Status: DC | PRN
  Administered 2022-10-01: 200 mg via INTRAVENOUS

## 2022-10-01 MED ORDER — HYDROCODONE-ACETAMINOPHEN 5-325 MG PO TABS: 1.0000 | ORAL_TABLET | ORAL | 0 refills | Status: AC | PRN

## 2022-10-01 MED ORDER — BUPIVACAINE-EPINEPHRINE (PF) 0.5% -1:200000 IJ SOLN
INTRAMUSCULAR | Status: AC
  Filled 2022-10-01: qty 30

## 2022-10-01 MED ORDER — FENTANYL CITRATE (PF) 100 MCG/2ML IJ SOLN
INTRAMUSCULAR | Status: AC
  Filled 2022-10-01: qty 2

## 2022-10-01 MED ORDER — PHENYLEPHRINE 80 MCG/ML (10ML) SYRINGE FOR IV PUSH (FOR BLOOD PRESSURE SUPPORT)
PREFILLED_SYRINGE | INTRAVENOUS | Status: AC
  Filled 2022-10-01: qty 10

## 2022-10-01 MED ORDER — OXYCODONE HCL 5 MG PO TABS
5.0000 mg | ORAL_TABLET | Freq: Once | ORAL | Status: AC | PRN
  Administered 2022-10-01: 5 mg via ORAL

## 2022-10-01 MED ORDER — OXYCODONE HCL 5 MG/5ML PO SOLN: 5.0000 mg | Freq: Once | ORAL | Status: AC | PRN

## 2022-10-01 MED ORDER — FENTANYL CITRATE (PF) 250 MCG/5ML IJ SOLN
INTRAMUSCULAR | Status: DC | PRN
  Administered 2022-10-01 (×2): 50 ug via INTRAVENOUS

## 2022-10-01 MED ORDER — CHLORHEXIDINE GLUCONATE 0.12 % MT SOLN
OROMUCOSAL | Status: AC
  Administered 2022-10-01: 15 mL via OROMUCOSAL
  Filled 2022-10-01: qty 15

## 2022-10-01 MED ORDER — LIDOCAINE HCL (PF) 2 % IJ SOLN
INTRAMUSCULAR | Status: AC
  Filled 2022-10-01: qty 5

## 2022-10-01 MED ORDER — ACETAMINOPHEN 10 MG/ML IV SOLN
INTRAVENOUS | Status: AC
  Filled 2022-10-01: qty 100

## 2022-10-01 MED ORDER — BUPIVACAINE-EPINEPHRINE (PF) 0.5% -1:200000 IJ SOLN
INTRAMUSCULAR | Status: DC | PRN
  Administered 2022-10-01: 30 mL via INTRAMUSCULAR

## 2022-10-01 MED ORDER — PHENYLEPHRINE HCL-NACL 20-0.9 MG/250ML-% IV SOLN
INTRAVENOUS | Status: AC
  Filled 2022-10-01: qty 250

## 2022-10-01 MED ORDER — ACETAMINOPHEN 10 MG/ML IV SOLN
INTRAVENOUS | Status: DC | PRN
  Administered 2022-10-01: 1000 mg via INTRAVENOUS

## 2022-10-01 MED ORDER — LABETALOL HCL 5 MG/ML IV SOLN
INTRAVENOUS | Status: AC
  Filled 2022-10-01: qty 4

## 2022-10-01 MED ORDER — CEFAZOLIN SODIUM-DEXTROSE 2-4 GM/100ML-% IV SOLN
2.0000 g | INTRAVENOUS | Status: AC
  Administered 2022-10-01: 2 g via INTRAVENOUS

## 2022-10-01 MED ORDER — DEXAMETHASONE SODIUM PHOSPHATE 10 MG/ML IJ SOLN
INTRAMUSCULAR | Status: AC
  Filled 2022-10-01: qty 1

## 2022-10-01 MED ORDER — ONDANSETRON HCL 4 MG/2ML IJ SOLN
INTRAMUSCULAR | Status: DC | PRN
  Administered 2022-10-01: 4 mg via INTRAVENOUS

## 2022-10-01 MED ORDER — CEFAZOLIN SODIUM-DEXTROSE 2-4 GM/100ML-% IV SOLN
INTRAVENOUS | Status: AC
  Filled 2022-10-01: qty 100

## 2022-10-01 MED ORDER — FAMOTIDINE 20 MG PO TABS
ORAL_TABLET | ORAL | Status: AC
  Filled 2022-10-01: qty 1

## 2022-10-01 MED ORDER — ONDANSETRON HCL 4 MG/2ML IJ SOLN
INTRAMUSCULAR | Status: AC
  Filled 2022-10-01: qty 2

## 2022-10-01 MED ORDER — LABETALOL HCL 5 MG/ML IV SOLN
INTRAVENOUS | Status: DC | PRN
  Administered 2022-10-01 (×4): 2.5 mg via INTRAVENOUS

## 2022-10-01 MED ORDER — MIDAZOLAM HCL 2 MG/2ML IJ SOLN
INTRAMUSCULAR | Status: AC
  Filled 2022-10-01: qty 2

## 2022-10-01 MED ORDER — MIDAZOLAM HCL 2 MG/2ML IJ SOLN
INTRAMUSCULAR | Status: DC | PRN
  Administered 2022-10-01: 2 mg via INTRAVENOUS

## 2022-10-01 MED ORDER — ALBUTEROL SULFATE HFA 108 (90 BASE) MCG/ACT IN AERS
INHALATION_SPRAY | RESPIRATORY_TRACT | Status: AC
  Filled 2022-10-01: qty 6.7

## 2022-10-01 MED ORDER — LIDOCAINE 2% (20 MG/ML) 5 ML SYRINGE
INTRAMUSCULAR | Status: DC | PRN
  Administered 2022-10-01: 100 mg via INTRAVENOUS

## 2022-10-01 MED ORDER — ROCURONIUM BROMIDE 10 MG/ML (PF) SYRINGE
PREFILLED_SYRINGE | INTRAVENOUS | Status: AC
  Filled 2022-10-01: qty 10

## 2022-10-01 MED ORDER — ALBUTEROL SULFATE HFA 108 (90 BASE) MCG/ACT IN AERS
INHALATION_SPRAY | RESPIRATORY_TRACT | Status: DC | PRN
  Administered 2022-10-01: 6 via RESPIRATORY_TRACT

## 2022-10-01 MED ORDER — FAMOTIDINE 20 MG PO TABS
20.0000 mg | ORAL_TABLET | Freq: Once | ORAL | Status: AC
  Administered 2022-10-01: 20 mg via ORAL

## 2022-10-01 MED ORDER — ORAL CARE MOUTH RINSE: 15.0000 mL | Freq: Once | OROMUCOSAL | Status: AC

## 2022-10-01 MED ORDER — DEXAMETHASONE SODIUM PHOSPHATE 10 MG/ML IJ SOLN
INTRAMUSCULAR | Status: DC | PRN
  Administered 2022-10-01: 10 mg via INTRAVENOUS

## 2022-10-01 MED ORDER — PROPOFOL 10 MG/ML IV BOLUS
INTRAVENOUS | Status: DC | PRN
  Administered 2022-10-01: 200 mg via INTRAVENOUS

## 2022-10-01 MED ORDER — FENTANYL CITRATE (PF) 100 MCG/2ML IJ SOLN: 25.0000 ug | INTRAMUSCULAR | Status: DC | PRN

## 2022-10-01 MED ORDER — LACTATED RINGERS IV SOLN: INTRAVENOUS | Status: DC

## 2022-10-01 MED ORDER — OXYCODONE HCL 5 MG PO TABS
ORAL_TABLET | ORAL | Status: AC
  Filled 2022-10-01: qty 1

## 2022-10-01 MED ORDER — CHLORHEXIDINE GLUCONATE 0.12 % MT SOLN: 15.0000 mL | Freq: Once | OROMUCOSAL | Status: AC

## 2022-10-01 MED ORDER — PROPOFOL 10 MG/ML IV BOLUS
INTRAVENOUS | Status: AC
  Filled 2022-10-01: qty 20

## 2022-10-01 SURGICAL SUPPLY — 57 items
ADH SKN CLS APL DERMABOND .7 (GAUZE/BANDAGES/DRESSINGS) ×2
BAG PRESSURE INF REUSE 1000 (BAG) IMPLANT
BLADE SURG SZ11 CARB STEEL (BLADE) ×2 IMPLANT
CANNULA REDUC XI 12-8 STAPL (CANNULA) ×2
CANNULA REDUCER 12-8 DVNC XI (CANNULA) IMPLANT
COVER TIP SHEARS 8 DVNC (MISCELLANEOUS) ×2 IMPLANT
COVER TIP SHEARS 8MM DA VINCI (MISCELLANEOUS) ×2
COVER WAND RF STERILE (DRAPES) ×2 IMPLANT
DERMABOND ADVANCED .7 DNX12 (GAUZE/BANDAGES/DRESSINGS) ×2 IMPLANT
DRAPE ARM DVNC X/XI (DISPOSABLE) ×6 IMPLANT
DRAPE COLUMN DVNC XI (DISPOSABLE) ×2 IMPLANT
DRAPE DA VINCI XI ARM (DISPOSABLE) ×6
DRAPE DA VINCI XI COLUMN (DISPOSABLE) ×2
ELECT REM PT RETURN 9FT ADLT (ELECTROSURGICAL) ×2
ELECTRODE REM PT RTRN 9FT ADLT (ELECTROSURGICAL) ×2 IMPLANT
GLOVE BIO SURGEON STRL SZ 6.5 (GLOVE) ×4 IMPLANT
GLOVE BIOGEL PI IND STRL 6.5 (GLOVE) ×4 IMPLANT
GOWN STRL REUS W/ TWL LRG LVL3 (GOWN DISPOSABLE) ×6 IMPLANT
GOWN STRL REUS W/TWL LRG LVL3 (GOWN DISPOSABLE) ×6
GRASPER SUT TROCAR 14GX15 (MISCELLANEOUS) IMPLANT
IRRIGATOR SUCT 8 DISP DVNC XI (IRRIGATION / IRRIGATOR) IMPLANT
IRRIGATOR SUCTION 8MM XI DISP (IRRIGATION / IRRIGATOR)
IV CATH ANGIO 12GX3 LT BLUE (NEEDLE) IMPLANT
IV NS 1000ML (IV SOLUTION)
IV NS 1000ML BAXH (IV SOLUTION) IMPLANT
KIT PINK PAD W/HEAD ARE REST (MISCELLANEOUS) ×2
KIT PINK PAD W/HEAD ARM REST (MISCELLANEOUS) ×2 IMPLANT
LABEL OR SOLS (LABEL) IMPLANT
MANIFOLD NEPTUNE II (INSTRUMENTS) ×2 IMPLANT
MESH 3DMAX 4X6 RT LRG (Mesh General) IMPLANT
MESH 3DMAX 5X7 LT XLRG (Mesh General) IMPLANT
NDL INSUFFLATION 14GA 120MM (NEEDLE) ×2 IMPLANT
NEEDLE HYPO 22GX1.5 SAFETY (NEEDLE) ×2 IMPLANT
NEEDLE INSUFFLATION 14GA 120MM (NEEDLE) ×2 IMPLANT
OBTURATOR OPTICAL STANDARD 8MM (TROCAR) ×2
OBTURATOR OPTICAL STND 8 DVNC (TROCAR) ×2
OBTURATOR OPTICALSTD 8 DVNC (TROCAR) ×2 IMPLANT
PACK LAP CHOLECYSTECTOMY (MISCELLANEOUS) ×2 IMPLANT
SEAL CANN UNIV 5-8 DVNC XI (MISCELLANEOUS) ×6 IMPLANT
SEAL XI 5MM-8MM UNIVERSAL (MISCELLANEOUS) ×6
SET TUBE SMOKE EVAC HIGH FLOW (TUBING) ×2 IMPLANT
SOLUTION ELECTROLUBE (MISCELLANEOUS) ×2 IMPLANT
STAPLER CANNULA SEAL DVNC XI (STAPLE) IMPLANT
STAPLER CANNULA SEAL XI (STAPLE) ×2
SUT MNCRL 4-0 (SUTURE) ×4
SUT MNCRL 4-0 27XMFL (SUTURE) ×4
SUT VIC AB 2-0 SH 27 (SUTURE) ×2
SUT VIC AB 2-0 SH 27XBRD (SUTURE) ×2 IMPLANT
SUT VIC AB 3-0 SH 27 (SUTURE) ×2
SUT VIC AB 3-0 SH 27X BRD (SUTURE) IMPLANT
SUT VICRYL 0 AB UR-6 (SUTURE) IMPLANT
SUT VLOC 90 S/L VL9 GS22 (SUTURE) ×2 IMPLANT
SUTURE MNCRL 4-0 27XMF (SUTURE) ×2 IMPLANT
TAPE TRANSPORE STRL 2 31045 (GAUZE/BANDAGES/DRESSINGS) IMPLANT
TRAP FLUID SMOKE EVACUATOR (MISCELLANEOUS) ×2 IMPLANT
TRAY FOLEY MTR SLVR 16FR STAT (SET/KITS/TRAYS/PACK) ×2 IMPLANT
WATER STERILE IRR 500ML POUR (IV SOLUTION) ×2 IMPLANT

## 2022-10-01 NOTE — Discharge Instructions (Addendum)
  Diet: Resume home heart healthy regular diet.   Activity: No heavy lifting >20 pounds (children, pets, laundry, garbage) or strenuous activity until follow-up, but light activity and walking are encouraged. Do not drive or drink alcohol if taking narcotic pain medications.  Wound care: May shower with soapy water and pat dry (do not rub incisions), but no baths or submerging incision underwater until follow-up. (no swimming)   Medications: Resume all home medications. For mild to moderate pain: acetaminophen (Tylenol) ***or ibuprofen (if no kidney disease). Combining Tylenol with alcohol can substantially increase your risk of causing liver disease. Narcotic pain medications, if prescribed, can be used for severe pain, though may cause nausea, constipation, and drowsiness. Do not combine Tylenol and Norco within a 6 hour period as Norco contains Tylenol. If you do not need the narcotic pain medication, you do not need to fill the prescription.  Call office ([phone]) at any time if any questions, worsening pain, fevers/chills, bleeding, drainage from incision site, or other concerns.   AMBULATORY SURGERY  DISCHARGE INSTRUCTIONS   The drugs that you were given will stay in your system until tomorrow so for the next 24 hours you should not:  Drive an automobile Make any legal decisions Drink any alcoholic beverage   You may resume regular meals tomorrow.  Today it is better to start with liquids and gradually work up to solid foods.  You may eat anything you prefer, but it is better to start with liquids, then soup and crackers, and gradually work up to solid foods.   Please notify your doctor immediately if you have any unusual bleeding, trouble breathing, redness and pain at the surgery site, drainage, fever, or pain not relieved by medication.    Additional Instructions:        Please contact your physician with any problems or Same Day Surgery at [phone], Monday  through Friday 6 am to 4 pm, or [facility] [place] Main number at [phone].  

## 2022-10-01 NOTE — Op Note (Signed)
Preoperative diagnosis: Bilateral inguinal hernia.   Postoperative diagnosis: Bilateral inguinal hernia.  Procedure: Robotic assisted Laparoscopic Transabdominal preperitoneal laparoscopic (TAPP) repair of bilateral inguinal hernia.  Anesthesia: GETA  Surgeon: Dr. Windell Moment  Wound Classification: Clean  Indications:  Patient is a 62 y.o. male developed a symptomatic bilateral inguinal hernia. Repair was indicated.  Findings: 1. Right indirect right inguinal hernia and left direct inguinal hernia identified 2. Vas deferens and cord structures identified and preserved 3. Bard Extra Large 3D Max Anatomical mesh used for repair of the left direct inguinal hernia and Bard Large 3D Max mesh used for right indirect inguinal hernia 4. Adequate hemostasis.     Description of procedure:  The patient was taken to the operating room and the correct side of surgery was verified. The patient was placed supine with right arm tucked at the side. After obtaining adequate anesthesia, the patient's abdomen was prepped and draped in standard sterile fashion. A time-out was completed verifying correct patient, procedure, site, positioning, and implant(s) and/or special equipment prior to beginning this procedure.  An incision was made in a natural skin line above the umbilicus. The fascia was elevated and the Veress needle inserted. Proper position was confirmed by aspiration and saline meniscus test.  The abdomen was insufflated with carbon dioxide to a pressure of 15 mmHg. The patient tolerated insufflation well.  Abdominal cavity was entered using Optiview technique with a millimeter trocar.  No injury was identified.  Another 2 mm trocars were placed lateral to each rectus muscle.  Scissors and bipolar forceps were inserted under direct visualization. At the robotic console: Transverse peritoneal incision is made about 8 cm superior to the large right inguinal defect. Medial to the epigastric vessels,  the parietal compartment is dissected to visualize the rectus muscle. This is carried down to the symphysis pubis and the retropubic space is dissected to expose at least 2 cm contralateral to the midline. Cooper's ligament is exposed and cleared at least 2 cm below the ligament to ensure adequate space for the inferior border of the mesh. Hesselbach's triangle is cleared assessing for a direct hernia. Lateral to the epigastric vessels, the dissection is carried out in visceral compartment continuing in the true preperitoneal plane. A very large, difficult and hard sac was dissected with use of force bipolar and caudier forceps. This was very difficult and time consuming due to the large sac and abundant amount of fat tissue. Indirect hernia sac, was carefully reduced and separated from the cord structures with medial retraction and a combination of blunt/sharp dissection and focused cautery. This dissection was continued until the cord structures are "parietalized" completely, allowing for visualization of the reflected peritoneum that is continuous with the line originating 2 cm below Coopers medially and across the psoas muscle in the lateral compartment.  The internal ring was interrogated for a cord lipoma. The cord lipoma was reduced to the retroperitoneum and seated dorsal to the preperitoneal mesh. Having achieved a complete dissection with a critical view of the entire myopectineal orifice, a large 3D Max mesh was then positioned centered at the iliopubic tract with the medial side crossing the midline and the inferior edge positioned 2 cm below Coopers ligament. The lateral aspect of the mesh extended 3-5 cm beyond the lateral edge of the psoas. The mesh is fixated using an interrupted suture placed to the ipsilateral Coopers ligament. A second suture was done at the medial superior aspect of the mesh fixating this to the rectus complex.  The left inguinal hernia was also difficult and time consuming due  to large direct hernia. On the left hernia, an XL 3D Max was placed to repair the hernia.  The peritoneal flap is closed with running barbed suture. Additional holes in the peritoneum closed with suture. Preperitoneal space gas aspirated to visualize the peritoneum apposed directly against the mesh and ensure no folding, lifting, or buckling of the mesh. Skin is closed, sterile dressings are applied.  The patient tolerated the procedure well and was taken to the postanesthesia care unit in stable condition  Specimen: None  Complications: None  Estimated Blood Loss: 10 mL

## 2022-10-01 NOTE — Transfer of Care (Signed)
Immediate Anesthesia Transfer of Care Note  Patient: Gary Webb  Procedure(s) Performed: XI ROBOTIC ASSISTED BILATERAL INGUINAL HERNIA (Bilateral: Groin) INSERTION OF MESH  Patient Location: PACU  Anesthesia Type:General  Level of Consciousness: drowsy and patient cooperative  Airway & Oxygen Therapy: Patient Spontanous Breathing and Patient connected to face mask oxygen  Post-op Assessment: Report given to RN and Post -op Vital signs reviewed and stable  Post vital signs: Reviewed and stable  Last Vitals:  Vitals Value Taken Time  BP 170/96 10/01/22 1316  Temp    Pulse 93 10/01/22 1328  Resp 23 10/01/22 1328  SpO2 89 % 10/01/22 1328  Vitals shown include unvalidated device data.  Last Pain:  Vitals:   10/01/22 0840  PainSc: 0-No pain         Complications: No notable events documented.

## 2022-10-01 NOTE — Interval H&P Note (Signed)
History and Physical Interval Note:  10/01/2022 9:42 AM  Gary Webb  has presented today for surgery, with the diagnosis of K40.20 non recurrent bil inguinal hernia w/o obstruction or gangrene.  The various methods of treatment have been discussed with the patient and family. After consideration of risks, benefits and other options for treatment, the patient has consented to  Procedure(s): XI ROBOTIC ASSISTED BILATERAL INGUINAL HERNIA (Bilateral) as a surgical intervention.  The patient's history has been reviewed, patient examined, no change in status, stable for surgery.  I have reviewed the patient's chart and labs.  Questions were answered to the patient's satisfaction.     Herbert Pun

## 2022-10-01 NOTE — Anesthesia Procedure Notes (Signed)
Procedure Name: Intubation Date/Time: 10/01/2022 10:12 AM  Performed by: Lia Foyer, CRNAPre-anesthesia Checklist: Patient identified, Emergency Drugs available, Suction available and Patient being monitored Patient Re-evaluated:Patient Re-evaluated prior to induction Oxygen Delivery Method: Circle system utilized Preoxygenation: Pre-oxygenation with 100% oxygen Induction Type: IV induction Ventilation: Mask ventilation with difficulty Laryngoscope Size: McGraph and 4 Grade View: Grade I Tube type: Oral Number of attempts: 1 Airway Equipment and Method: Stylet and Oral airway Placement Confirmation: ETT inserted through vocal cords under direct vision, positive ETCO2 and breath sounds checked- equal and bilateral Secured at: 22 cm Tube secured with: Tape Dental Injury: Teeth and Oropharynx as per pre-operative assessment

## 2022-10-01 NOTE — Anesthesia Preprocedure Evaluation (Signed)
Anesthesia Evaluation  Patient identified by MRN, date of birth, ID band Patient awake    Reviewed: Allergy & Precautions, NPO status , Patient's Chart, lab work & pertinent test results  History of Anesthesia Complications Negative for: history of anesthetic complications  Airway Mallampati: III  TM Distance: >3 FB Neck ROM: full    Dental  (+) Dental Advidsory Given, Poor Dentition, Missing   Pulmonary neg pulmonary ROS, neg COPD, Current Smoker and Patient abstained from smoking.   Pulmonary exam normal        Cardiovascular hypertension, (-) angina (-) Past MI negative cardio ROS Normal cardiovascular exam     Neuro/Psych negative neurological ROS  negative psych ROS   GI/Hepatic negative GI ROS, Neg liver ROS,,,  Endo/Other  negative endocrine ROS    Renal/GU      Musculoskeletal   Abdominal   Peds  Hematology negative hematology ROS (+)   Anesthesia Other Findings Past Medical History: No date: Gout No date: Hyperlipidemia No date: Hypertension No date: Inguinal hernia  History reviewed. No pertinent surgical history.  BMI    Body Mass Index: 37.75 kg/m      Reproductive/Obstetrics negative OB ROS                             Anesthesia Physical Anesthesia Plan  ASA: 3  Anesthesia Plan: General ETT   Post-op Pain Management:    Induction: Intravenous  PONV Risk Score and Plan: 3 and Dexamethasone, Ondansetron and Midazolam  Airway Management Planned: Oral ETT  Additional Equipment:   Intra-op Plan:   Post-operative Plan: Extubation in OR  Informed Consent: I have reviewed the patients History and Physical, chart, labs and discussed the procedure including the risks, benefits and alternatives for the proposed anesthesia with the patient or authorized representative who has indicated his/her understanding and acceptance.     Dental Advisory Given  Plan  Discussed with: Anesthesiologist, CRNA and Surgeon  Anesthesia Plan Comments: (Patient consented for risks of anesthesia including but not limited to:  - adverse reactions to medications - damage to eyes, teeth, lips or other oral mucosa - nerve damage due to positioning  - sore throat or hoarseness - Damage to heart, brain, nerves, lungs, other parts of body or loss of life  Patient voiced understanding.)       Anesthesia Quick Evaluation

## 2022-10-02 ENCOUNTER — Encounter: Payer: Self-pay | Admitting: General Surgery

## 2022-10-02 NOTE — Anesthesia Postprocedure Evaluation (Signed)
Anesthesia Post Note  Patient: Gary Webb  Procedure(s) Performed: XI ROBOTIC ASSISTED BILATERAL INGUINAL HERNIA (Bilateral: Groin) INSERTION OF MESH  Patient location during evaluation: PACU Anesthesia Type: General Level of consciousness: awake and alert Pain management: pain level controlled Vital Signs Assessment: post-procedure vital signs reviewed and stable Respiratory status: spontaneous breathing, nonlabored ventilation, respiratory function stable and patient connected to nasal cannula oxygen Cardiovascular status: blood pressure returned to baseline and stable Postop Assessment: no apparent nausea or vomiting Anesthetic complications: no  No notable events documented.   Last Vitals:  Vitals:   10/01/22 1451 10/01/22 1546  BP: (!) 143/96 (!) 144/95  Pulse: 85 89  Resp: 20 18  Temp: 36.6 C   SpO2: 93% 94%    Last Pain:  Vitals:   10/02/22 1044  TempSrc:   PainSc: [name]

## 2024-01-14 ENCOUNTER — Other Ambulatory Visit: Payer: Self-pay | Admitting: Physician Assistant

## 2024-01-14 DIAGNOSIS — R768 Other specified abnormal immunological findings in serum: Secondary | ICD-10-CM

## 2024-01-14 DIAGNOSIS — R748 Abnormal levels of other serum enzymes: Secondary | ICD-10-CM

## 2024-01-22 ENCOUNTER — Ambulatory Visit
Admission: RE | Admit: 2024-01-22 | Discharge: 2024-01-22 | Disposition: A | Discharge status: Home / Self Care | Source: Ambulatory Visit | Attending: Physician Assistant | Admitting: Physician Assistant

## 2024-01-22 DIAGNOSIS — R748 Abnormal levels of other serum enzymes: Secondary | ICD-10-CM

## 2024-01-22 DIAGNOSIS — R768 Other specified abnormal immunological findings in serum: Secondary | ICD-10-CM

## 2024-01-26 ENCOUNTER — Ambulatory Visit
Admission: RE | Admit: 2024-01-26 | Discharge: 2024-01-26 | Disposition: A | Discharge status: Home / Self Care | Source: Ambulatory Visit | Attending: Physician Assistant | Admitting: Physician Assistant

## 2024-01-26 DIAGNOSIS — R768 Other specified abnormal immunological findings in serum: Secondary | ICD-10-CM | POA: Insufficient documentation

## 2024-01-26 DIAGNOSIS — R748 Abnormal levels of other serum enzymes: Secondary | ICD-10-CM | POA: Diagnosis present
